# Patient Record
Sex: Female | Born: 1945 | Race: White | Hispanic: No | Marital: Married | Smoking: Never smoker
Health system: Southern US, Community
[De-identification: ages and names within clinical notes are randomized; demographics above are authoritative.]

## PROBLEM LIST (undated history)

## (undated) DIAGNOSIS — D649 Anemia, unspecified: Secondary | ICD-10-CM

## (undated) DIAGNOSIS — K219 Gastro-esophageal reflux disease without esophagitis: Secondary | ICD-10-CM

## (undated) DIAGNOSIS — I10 Essential (primary) hypertension: Secondary | ICD-10-CM

## (undated) DIAGNOSIS — M1990 Unspecified osteoarthritis, unspecified site: Secondary | ICD-10-CM

## (undated) DIAGNOSIS — Z8739 Personal history of other diseases of the musculoskeletal system and connective tissue: Secondary | ICD-10-CM

## (undated) DIAGNOSIS — Z8709 Personal history of other diseases of the respiratory system: Secondary | ICD-10-CM

## (undated) DIAGNOSIS — M2550 Pain in unspecified joint: Secondary | ICD-10-CM

## (undated) HISTORY — PX: COLONOSCOPY: SHX174

## (undated) HISTORY — PX: ESOPHAGOGASTRODUODENOSCOPY: SHX1529

---
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
## 1998-09-24 HISTORY — PX: GASTRIC BYPASS: SHX52

## 1999-09-25 HISTORY — PX: ABDOMINAL HYSTERECTOMY: SHX81

## 2014-03-04 ENCOUNTER — Encounter (HOSPITAL_COMMUNITY): Payer: Self-pay | Admitting: Pharmacy Technician

## 2014-03-10 ENCOUNTER — Encounter (HOSPITAL_COMMUNITY): Payer: Self-pay

## 2014-03-10 ENCOUNTER — Encounter (HOSPITAL_COMMUNITY)
Admission: RE | Admit: 2014-03-10 | Discharge: 2014-03-10 | Disposition: A | Discharge status: Home / Self Care | Payer: Medicare Other | Source: Ambulatory Visit | Attending: Orthopaedic Surgery | Admitting: Orthopaedic Surgery

## 2014-03-10 ENCOUNTER — Ambulatory Visit (HOSPITAL_COMMUNITY)
Admission: RE | Admit: 2014-03-10 | Discharge: 2014-03-10 | Disposition: A | Discharge status: Home / Self Care | Payer: Medicare Other | Source: Ambulatory Visit | Attending: Orthopedic Surgery | Admitting: Orthopedic Surgery

## 2014-03-10 DIAGNOSIS — Z01812 Encounter for preprocedural laboratory examination: Secondary | ICD-10-CM | POA: Insufficient documentation

## 2014-03-10 DIAGNOSIS — Z01818 Encounter for other preprocedural examination: Secondary | ICD-10-CM | POA: Insufficient documentation

## 2014-03-10 HISTORY — DX: Essential (primary) hypertension: I10

## 2014-03-10 HISTORY — DX: Personal history of other diseases of the respiratory system: Z87.09

## 2014-03-10 HISTORY — DX: Personal history of other diseases of the musculoskeletal system and connective tissue: Z87.39

## 2014-03-10 HISTORY — DX: Pain in unspecified joint: M25.50

## 2014-03-10 HISTORY — DX: Gastro-esophageal reflux disease without esophagitis: K21.9

## 2014-03-10 HISTORY — DX: Unspecified osteoarthritis, unspecified site: M19.90

## 2014-03-10 HISTORY — DX: Anemia, unspecified: D64.9

## 2014-03-10 LAB — CBC WITH DIFFERENTIAL/PLATELET
BASOS ABS: 0 10*3/uL (ref 0.0–0.1)
BASOS PCT: 0 % (ref 0–1)
Eosinophils Absolute: 0.2 10*3/uL (ref 0.0–0.7)
Eosinophils Relative: 2 % (ref 0–5)
HCT: 30.3 % — ABNORMAL LOW (ref 36.0–46.0)
HEMOGLOBIN: 9.5 g/dL — AB (ref 12.0–15.0)
Lymphocytes Relative: 31 % (ref 12–46)
Lymphs Abs: 2.1 10*3/uL (ref 0.7–4.0)
MCH: 24.5 pg — ABNORMAL LOW (ref 26.0–34.0)
MCHC: 31.4 g/dL (ref 30.0–36.0)
MCV: 78.3 fL (ref 78.0–100.0)
MONOS PCT: 6 % (ref 3–12)
Monocytes Absolute: 0.4 10*3/uL (ref 0.1–1.0)
NEUTROS PCT: 61 % (ref 43–77)
Neutro Abs: 4 10*3/uL (ref 1.7–7.7)
Platelets: 298 10*3/uL (ref 150–400)
RBC: 3.87 MIL/uL (ref 3.87–5.11)
RDW: 16 % — AB (ref 11.5–15.5)
WBC: 6.7 10*3/uL (ref 4.0–10.5)

## 2014-03-10 LAB — COMPREHENSIVE METABOLIC PANEL
ALBUMIN: 3.6 g/dL (ref 3.5–5.2)
ALK PHOS: 110 U/L (ref 39–117)
ALT: 6 U/L (ref 0–35)
AST: 14 U/L (ref 0–37)
BILIRUBIN TOTAL: 0.3 mg/dL (ref 0.3–1.2)
BUN: 11 mg/dL (ref 6–23)
CHLORIDE: 105 meq/L (ref 96–112)
CO2: 25 mEq/L (ref 19–32)
Calcium: 9.4 mg/dL (ref 8.4–10.5)
Creatinine, Ser: 0.56 mg/dL (ref 0.50–1.10)
GFR calc Af Amer: >90 mL/min (ref >90)
GFR calc non Af Amer: >90 mL/min (ref >90)
Glucose, Bld: 103 mg/dL — ABNORMAL HIGH (ref 70–99)
POTASSIUM: 4.9 meq/L (ref 3.7–5.3)
SODIUM: 144 meq/L (ref 137–147)
Total Protein: 6.9 g/dL (ref 6.0–8.3)

## 2014-03-10 LAB — URINALYSIS, ROUTINE W REFLEX MICROSCOPIC
Bilirubin Urine: NEGATIVE
Glucose, UA: NEGATIVE mg/dL
Hgb urine dipstick: NEGATIVE
Ketones, ur: NEGATIVE mg/dL
LEUKOCYTES UA: NEGATIVE
NITRITE: NEGATIVE
Protein, ur: NEGATIVE mg/dL
SPECIFIC GRAVITY, URINE: 1.019 (ref 1.005–1.030)
Urobilinogen, UA: 1 mg/dL (ref 0.0–1.0)
pH: 5.5 (ref 5.0–8.0)

## 2014-03-10 LAB — TYPE AND SCREEN
ABO/RH(D): A NEG
Antibody Screen: NEGATIVE

## 2014-03-10 LAB — SURGICAL PCR SCREEN
MRSA, PCR: NEGATIVE
Staphylococcus aureus: NEGATIVE

## 2014-03-10 LAB — PROTIME-INR
INR: 1.01 (ref 0.00–1.49)
Prothrombin Time: 13.1 seconds (ref 11.6–15.2)

## 2014-03-10 LAB — ABO/RH: ABO/RH(D): A NEG

## 2014-03-10 LAB — APTT: aPTT: 31 seconds (ref 24–37)

## 2014-03-10 MED ORDER — CHLORHEXIDINE GLUCONATE 4 % EX LIQD: 60.0000 mL | Freq: Once | CUTANEOUS | Status: DC

## 2014-03-10 MED ORDER — CHLORHEXIDINE GLUCONATE 4 % EX LIQD: 60.0000 mL | Freq: Every day | CUTANEOUS | Status: DC

## 2014-03-10 NOTE — Pre-Procedure Instructions (Signed)
Leah Warner  03/10/2014   Your procedure is scheduled on:  Tues, June 23 @ 7:15 AM  [facility]  at 5:30 AM.  Call this number if you have problems the morning of surgery: [phone]   Remember:   Do not eat food or drink liquids after midnight.     Do not wear jewelry, make-up or nail polish.  Do not wear lotions, powders, or perfumes. You may wear deodorant.  Do not shave 48 hours prior to surgery.   Do not bring valuables to the hospital.  [facility] responsible                  for any belongings or valuables.               Contacts, dentures or bridgework may not be worn into surgery.  Leave suitcase in the car. After surgery it may be brought to your room.  For patients admitted to the hospital, discharge time is determined by your                treatment team.                 Special Instructions:  [place] - Preparing for Surgery  Before surgery, you can play an important role.  Because skin is not sterile, your skin needs to be as free of germs as possible.  You can reduce the number of germs on you skin by washing with CHG (chlorahexidine gluconate) soap before surgery.  CHG is an antiseptic cleaner which kills germs and bonds with the skin to continue killing germs even after washing.  Please DO NOT use if you have an allergy to CHG or antibacterial soaps.  If your skin becomes reddened/irritated stop using the CHG and inform your nurse when you arrive at Short Stay.  Do not shave (including legs and underarms) for at least 48 hours prior to the first CHG shower.  You may shave your face.  Please follow these instructions carefully:   1.  Shower with CHG Soap the night before surgery and the                                morning of Surgery.  2.  If you choose to wash your hair, wash your hair first as usual with your       normal shampoo.  3.  After you shampoo, rinse your hair and body thoroughly to remove the                       Shampoo.  4.  Use CHG as you would any other liquid soap.  You can apply chg directly       to the skin and wash gently with scrungie or a clean washcloth.  5.  Apply the CHG Soap to your body ONLY FROM THE NECK DOWN.        Do not use on open wounds or open sores.  Avoid contact with your eyes,       ears, mouth and genitals (private parts).  Wash genitals (private parts)       with your normal soap.  6.  Wash thoroughly, paying special attention to the area where your surgery        will be performed.  7.  Thoroughly rinse your body with warm water from  the neck down.  8.  DO NOT shower/wash with your normal soap after using and rinsing off       the CHG Soap.  9.  Pat yourself dry with a clean towel.            10.  Wear clean pajamas.            11.  Place clean sheets on your bed the night of your first shower and do not        sleep with pets.  Day of Surgery  Do not apply any lotions/deoderants the morning of surgery.  Please wear clean clothes to the hospital/surgery center.     Please read over the following fact sheets that you were given: Pain Booklet, Coughing and Deep Breathing, Blood Transfusion Information, MRSA Information and Surgical Site Infection Prevention

## 2014-03-10 NOTE — Progress Notes (Signed)
Pt doesn't have a cardiologist  Denies ever having a stress test/heart cath  Echo done 15+yrs ago  Denies EKG or CXR in past yr  [facility]

## 2014-03-11 LAB — URINE CULTURE

## 2014-03-15 MED ORDER — SODIUM CHLORIDE 0.9 % IV SOLN: INTRAVENOUS | Status: DC

## 2014-03-15 MED ORDER — ACETAMINOPHEN 10 MG/ML IV SOLN: 1000.0000 mg | Freq: Once | INTRAVENOUS | Status: DC

## 2014-03-15 MED ORDER — CEFAZOLIN SODIUM-DEXTROSE 2-3 GM-% IV SOLR
2.0000 g | INTRAVENOUS | Status: AC
  Administered 2014-03-16: 2 g via INTRAVENOUS
  Filled 2014-03-15: qty 50

## 2014-03-16 ENCOUNTER — Inpatient Hospital Stay (HOSPITAL_COMMUNITY)
Admission: RE | Admit: 2014-03-16 | Discharge: 2014-03-18 | DRG: 470 | Disposition: A | Discharge status: Home / Self Care | Payer: Medicare Other | Source: Ambulatory Visit | Attending: Orthopaedic Surgery | Admitting: Orthopaedic Surgery

## 2014-03-16 ENCOUNTER — Inpatient Hospital Stay (HOSPITAL_COMMUNITY): Payer: Medicare Other | Admitting: Anesthesiology

## 2014-03-16 ENCOUNTER — Encounter (HOSPITAL_COMMUNITY)
Admission: RE | Disposition: A | Discharge status: Home / Self Care | Payer: Self-pay | Source: Ambulatory Visit | Attending: Orthopaedic Surgery

## 2014-03-16 ENCOUNTER — Encounter (HOSPITAL_COMMUNITY): Payer: Medicare Other | Admitting: Anesthesiology

## 2014-03-16 ENCOUNTER — Encounter (HOSPITAL_COMMUNITY): Payer: Self-pay | Admitting: *Deleted

## 2014-03-16 DIAGNOSIS — D62 Acute posthemorrhagic anemia: Secondary | ICD-10-CM | POA: Diagnosis not present

## 2014-03-16 DIAGNOSIS — D519 Vitamin B12 deficiency anemia, unspecified: Secondary | ICD-10-CM | POA: Diagnosis present

## 2014-03-16 DIAGNOSIS — Z6839 Body mass index (BMI) 39.0-39.9, adult: Secondary | ICD-10-CM

## 2014-03-16 DIAGNOSIS — M1712 Unilateral primary osteoarthritis, left knee: Secondary | ICD-10-CM | POA: Diagnosis present

## 2014-03-16 DIAGNOSIS — I10 Essential (primary) hypertension: Secondary | ICD-10-CM | POA: Diagnosis present

## 2014-03-16 DIAGNOSIS — Z9884 Bariatric surgery status: Secondary | ICD-10-CM

## 2014-03-16 DIAGNOSIS — Z96659 Presence of unspecified artificial knee joint: Secondary | ICD-10-CM

## 2014-03-16 DIAGNOSIS — D509 Iron deficiency anemia, unspecified: Secondary | ICD-10-CM

## 2014-03-16 DIAGNOSIS — M1710 Unilateral primary osteoarthritis, unspecified knee: Principal | ICD-10-CM | POA: Diagnosis present

## 2014-03-16 HISTORY — PX: TOTAL KNEE ARTHROPLASTY: SHX125

## 2014-03-16 SURGERY — ARTHROPLASTY, KNEE, TOTAL
Anesthesia: General | Site: Knee | Laterality: Left

## 2014-03-16 MED ORDER — FENTANYL CITRATE 0.05 MG/ML IJ SOLN
INTRAMUSCULAR | Status: DC | PRN
  Administered 2014-03-16: 125 ug via INTRAVENOUS
  Administered 2014-03-16 (×5): 25 ug via INTRAVENOUS
  Administered 2014-03-16 (×2): 50 ug via INTRAVENOUS
  Administered 2014-03-16 (×3): 25 ug via INTRAVENOUS

## 2014-03-16 MED ORDER — FENTANYL CITRATE 0.05 MG/ML IJ SOLN
INTRAMUSCULAR | Status: AC
  Filled 2014-03-16: qty 5

## 2014-03-16 MED ORDER — LIDOCAINE HCL (CARDIAC) 20 MG/ML IV SOLN
INTRAVENOUS | Status: DC | PRN
  Administered 2014-03-16: 40 mg via INTRAVENOUS

## 2014-03-16 MED ORDER — FLEET ENEMA 7-19 GM/118ML RE ENEM: 1.0000 | ENEMA | Freq: Once | RECTAL | Status: AC | PRN

## 2014-03-16 MED ORDER — KETOROLAC TROMETHAMINE 15 MG/ML IJ SOLN
15.0000 mg | Freq: Four times a day (QID) | INTRAMUSCULAR | Status: AC
  Administered 2014-03-16 (×2): 15 mg via INTRAVENOUS
  Filled 2014-03-16: qty 1

## 2014-03-16 MED ORDER — ONDANSETRON HCL 4 MG/2ML IJ SOLN
INTRAMUSCULAR | Status: AC
  Filled 2014-03-16: qty 2

## 2014-03-16 MED ORDER — RIVAROXABAN 10 MG PO TABS
10.0000 mg | ORAL_TABLET | ORAL | Status: DC
  Administered 2014-03-16 – 2014-03-17 (×2): 10 mg via ORAL
  Filled 2014-03-16 (×3): qty 1

## 2014-03-16 MED ORDER — SODIUM CHLORIDE 0.9 % IR SOLN
Status: DC | PRN
  Administered 2014-03-16: 1

## 2014-03-16 MED ORDER — HYDROMORPHONE HCL PF 1 MG/ML IJ SOLN
INTRAMUSCULAR | Status: AC
  Filled 2014-03-16: qty 1

## 2014-03-16 MED ORDER — THROMBIN 20000 UNITS EX SOLR
CUTANEOUS | Status: AC
  Filled 2014-03-16: qty 20000

## 2014-03-16 MED ORDER — BUPIVACAINE-EPINEPHRINE (PF) 0.25% -1:200000 IJ SOLN
INTRAMUSCULAR | Status: AC
  Filled 2014-03-16: qty 30

## 2014-03-16 MED ORDER — ONDANSETRON HCL 4 MG/2ML IJ SOLN
INTRAMUSCULAR | Status: DC | PRN
Start: 2014-03-16 — End: 2014-03-16
  Administered 2014-03-16: 4 mg via INTRAVENOUS

## 2014-03-16 MED ORDER — MAGNESIUM HYDROXIDE 400 MG/5ML PO SUSP: 30.0000 mL | Freq: Every day | ORAL | Status: DC | PRN

## 2014-03-16 MED ORDER — LIDOCAINE HCL (CARDIAC) 20 MG/ML IV SOLN
INTRAVENOUS | Status: AC
  Filled 2014-03-16: qty 5

## 2014-03-16 MED ORDER — KETOROLAC TROMETHAMINE 30 MG/ML IJ SOLN: 15.0000 mg | Freq: Once | INTRAMUSCULAR | Status: DC | PRN

## 2014-03-16 MED ORDER — BUPIVACAINE-EPINEPHRINE 0.25% -1:200000 IJ SOLN
INTRAMUSCULAR | Status: DC | PRN
  Administered 2014-03-16: 30 mL

## 2014-03-16 MED ORDER — HYDROMORPHONE HCL PF 1 MG/ML IJ SOLN
0.2500 mg | INTRAMUSCULAR | Status: DC | PRN
  Administered 2014-03-16: 0.25 mg via INTRAVENOUS

## 2014-03-16 MED ORDER — OXYCODONE HCL 5 MG PO TABS
5.0000 mg | ORAL_TABLET | ORAL | Status: DC | PRN
  Administered 2014-03-16 – 2014-03-18 (×9): 10 mg via ORAL
  Filled 2014-03-16 (×9): qty 2

## 2014-03-16 MED ORDER — SODIUM CHLORIDE 0.9 % IV SOLN
75.0000 mL/h | INTRAVENOUS | Status: DC
  Administered 2014-03-17: 75 mL/h via INTRAVENOUS

## 2014-03-16 MED ORDER — THROMBIN 20000 UNITS EX KIT
PACK | CUTANEOUS | Status: DC | PRN
  Administered 2014-03-16: 20000 [IU] via TOPICAL

## 2014-03-16 MED ORDER — MIDAZOLAM HCL 5 MG/5ML IJ SOLN
INTRAMUSCULAR | Status: DC | PRN
  Administered 2014-03-16: 1 mg via INTRAVENOUS

## 2014-03-16 MED ORDER — METHOCARBAMOL 500 MG PO TABS
500.0000 mg | ORAL_TABLET | Freq: Four times a day (QID) | ORAL | Status: DC | PRN
  Administered 2014-03-16 – 2014-03-18 (×3): 500 mg via ORAL
  Filled 2014-03-16 (×3): qty 1

## 2014-03-16 MED ORDER — ONDANSETRON HCL 4 MG/2ML IJ SOLN: 4.0000 mg | Freq: Once | INTRAMUSCULAR | Status: DC | PRN

## 2014-03-16 MED ORDER — SUCCINYLCHOLINE CHLORIDE 20 MG/ML IJ SOLN
INTRAMUSCULAR | Status: DC | PRN
  Administered 2014-03-16: 30 mg via INTRAVENOUS
  Administered 2014-03-16: 120 mg via INTRAVENOUS

## 2014-03-16 MED ORDER — ALUM & MAG HYDROXIDE-SIMETH 200-200-20 MG/5ML PO SUSP
30.0000 mL | ORAL | Status: DC | PRN
  Administered 2014-03-17: 30 mL via ORAL
  Filled 2014-03-16: qty 30

## 2014-03-16 MED ORDER — LISINOPRIL 40 MG PO TABS
40.0000 mg | ORAL_TABLET | Freq: Every day | ORAL | Status: DC
  Administered 2014-03-16 – 2014-03-18 (×3): 40 mg via ORAL
  Filled 2014-03-16 (×3): qty 1

## 2014-03-16 MED ORDER — ONDANSETRON HCL 4 MG/2ML IJ SOLN: 4.0000 mg | Freq: Four times a day (QID) | INTRAMUSCULAR | Status: DC | PRN

## 2014-03-16 MED ORDER — METHOCARBAMOL 1000 MG/10ML IJ SOLN
500.0000 mg | Freq: Four times a day (QID) | INTRAVENOUS | Status: DC | PRN
  Filled 2014-03-16: qty 5

## 2014-03-16 MED ORDER — METOCLOPRAMIDE HCL 10 MG PO TABS: 5.0000 mg | ORAL_TABLET | Freq: Three times a day (TID) | ORAL | Status: DC | PRN

## 2014-03-16 MED ORDER — PHENOL 1.4 % MT LIQD: 1.0000 | OROMUCOSAL | Status: DC | PRN

## 2014-03-16 MED ORDER — CEFAZOLIN SODIUM-DEXTROSE 2-3 GM-% IV SOLR
2.0000 g | Freq: Four times a day (QID) | INTRAVENOUS | Status: AC
  Administered 2014-03-16 (×2): 2 g via INTRAVENOUS
  Filled 2014-03-16 (×2): qty 50

## 2014-03-16 MED ORDER — ONDANSETRON HCL 4 MG PO TABS: 4.0000 mg | ORAL_TABLET | Freq: Four times a day (QID) | ORAL | Status: DC | PRN

## 2014-03-16 MED ORDER — DOCUSATE SODIUM 100 MG PO CAPS
100.0000 mg | ORAL_CAPSULE | Freq: Two times a day (BID) | ORAL | Status: DC
  Administered 2014-03-16 – 2014-03-18 (×5): 100 mg via ORAL
  Filled 2014-03-16 (×5): qty 1

## 2014-03-16 MED ORDER — ACETAMINOPHEN 325 MG PO TABS
650.0000 mg | ORAL_TABLET | Freq: Four times a day (QID) | ORAL | Status: DC | PRN
  Administered 2014-03-17: 650 mg via ORAL
  Filled 2014-03-16 (×2): qty 2

## 2014-03-16 MED ORDER — BISACODYL 10 MG RE SUPP: 10.0000 mg | Freq: Every day | RECTAL | Status: DC | PRN

## 2014-03-16 MED ORDER — PROPOFOL 10 MG/ML IV BOLUS
INTRAVENOUS | Status: AC
  Filled 2014-03-16: qty 20

## 2014-03-16 MED ORDER — ALBUTEROL SULFATE HFA 108 (90 BASE) MCG/ACT IN AERS
INHALATION_SPRAY | RESPIRATORY_TRACT | Status: DC | PRN
  Administered 2014-03-16: 2 via RESPIRATORY_TRACT

## 2014-03-16 MED ORDER — ACETAMINOPHEN 10 MG/ML IV SOLN
INTRAVENOUS | Status: AC
Start: 2014-03-16 — End: 2014-03-16
  Filled 2014-03-16: qty 100

## 2014-03-16 MED ORDER — ACETAMINOPHEN 10 MG/ML IV SOLN
1000.0000 mg | Freq: Four times a day (QID) | INTRAVENOUS | Status: AC
  Administered 2014-03-16 – 2014-03-17 (×3): 1000 mg via INTRAVENOUS
  Filled 2014-03-16 (×3): qty 100

## 2014-03-16 MED ORDER — MENTHOL 3 MG MT LOZG
1.0000 | LOZENGE | OROMUCOSAL | Status: DC | PRN
  Administered 2014-03-18: 3 mg via ORAL
  Filled 2014-03-16: qty 9

## 2014-03-16 MED ORDER — KETOROLAC TROMETHAMINE 15 MG/ML IJ SOLN
INTRAMUSCULAR | Status: AC
  Filled 2014-03-16: qty 1

## 2014-03-16 MED ORDER — HYDROMORPHONE HCL PF 1 MG/ML IJ SOLN
0.5000 mg | INTRAMUSCULAR | Status: DC | PRN
  Administered 2014-03-17: 0.5 mg via INTRAVENOUS
  Filled 2014-03-16: qty 1

## 2014-03-16 MED ORDER — ACETAMINOPHEN 650 MG RE SUPP: 650.0000 mg | Freq: Four times a day (QID) | RECTAL | Status: DC | PRN

## 2014-03-16 MED ORDER — MIDAZOLAM HCL 2 MG/2ML IJ SOLN
INTRAMUSCULAR | Status: AC
  Filled 2014-03-16: qty 2

## 2014-03-16 MED ORDER — LACTATED RINGERS IV SOLN
INTRAVENOUS | Status: DC | PRN
  Administered 2014-03-16: 07:00:00 via INTRAVENOUS

## 2014-03-16 MED ORDER — PROPOFOL 10 MG/ML IV BOLUS
INTRAVENOUS | Status: DC | PRN
  Administered 2014-03-16: 20 mg via INTRAVENOUS
  Administered 2014-03-16: 180 mg via INTRAVENOUS

## 2014-03-16 MED ORDER — THROMBIN 20000 UNITS EX KIT
PACK | CUTANEOUS | Status: AC
  Filled 2014-03-16: qty 1

## 2014-03-16 MED ORDER — METOCLOPRAMIDE HCL 5 MG/ML IJ SOLN: 5.0000 mg | Freq: Three times a day (TID) | INTRAMUSCULAR | Status: DC | PRN

## 2014-03-16 SURGICAL SUPPLY — 65 items
BANDAGE ESMARK 6X9 LF (GAUZE/BANDAGES/DRESSINGS) ×1 IMPLANT
BLADE SAGITTAL 25.0X1.19X90 (BLADE) ×2 IMPLANT
BLADE SAGITTAL 25.0X1.19X90MM (BLADE) ×1
BNDG ESMARK 6X9 LF (GAUZE/BANDAGES/DRESSINGS) ×3
BOWL SMART MIX CTS (DISPOSABLE) ×3 IMPLANT
CAP UPCHARGE REVISION TRAY ×3 IMPLANT
CAPT RP KNEE ×3 IMPLANT
CEMENT HV SMART SET (Cement) ×6 IMPLANT
COVER BACK TABLE 24X17X13 BIG (DRAPES) ×3 IMPLANT
COVER SURGICAL LIGHT HANDLE (MISCELLANEOUS) ×3 IMPLANT
CUFF TOURNIQUET SINGLE 34IN LL (TOURNIQUET CUFF) IMPLANT
CUFF TOURNIQUET SINGLE 44IN (TOURNIQUET CUFF) ×3 IMPLANT
DRAPE EXTREMITY T 121X128X90 (DRAPE) ×3 IMPLANT
DRAPE PROXIMA HALF (DRAPES) ×3 IMPLANT
DRSG ADAPTIC 3X8 NADH LF (GAUZE/BANDAGES/DRESSINGS) ×3 IMPLANT
DRSG PAD ABDOMINAL 8X10 ST (GAUZE/BANDAGES/DRESSINGS) ×6 IMPLANT
DURAPREP 26ML APPLICATOR (WOUND CARE) ×3 IMPLANT
ELECT CAUTERY BLADE 6.4 (BLADE) ×3 IMPLANT
ELECT REM PT RETURN 9FT ADLT (ELECTROSURGICAL) ×3
ELECTRODE REM PT RTRN 9FT ADLT (ELECTROSURGICAL) ×1 IMPLANT
EVACUATOR 1/8 PVC DRAIN (DRAIN) IMPLANT
FACESHIELD WRAPAROUND (MASK) ×6 IMPLANT
FLOSEAL 10ML (HEMOSTASIS) IMPLANT
GLOVE BIOGEL M STRL SZ7.5 (GLOVE) ×6 IMPLANT
GLOVE BIOGEL PI IND STRL 7.0 (GLOVE) ×4 IMPLANT
GLOVE BIOGEL PI IND STRL 8 (GLOVE) ×1 IMPLANT
GLOVE BIOGEL PI IND STRL 8.5 (GLOVE) ×1 IMPLANT
GLOVE BIOGEL PI INDICATOR 7.0 (GLOVE) ×8
GLOVE BIOGEL PI INDICATOR 8 (GLOVE) ×2
GLOVE BIOGEL PI INDICATOR 8.5 (GLOVE) ×2
GLOVE ECLIPSE 8.0 STRL XLNG CF (GLOVE) ×6 IMPLANT
GLOVE SURG ORTHO 8.5 STRL (GLOVE) ×9 IMPLANT
GLOVE SURG SS PI 7.0 STRL IVOR (GLOVE) ×6 IMPLANT
GOWN STRL REUS W/ TWL LRG LVL3 (GOWN DISPOSABLE) ×4 IMPLANT
GOWN STRL REUS W/TWL 2XL LVL3 (GOWN DISPOSABLE) ×3 IMPLANT
GOWN STRL REUS W/TWL LRG LVL3 (GOWN DISPOSABLE) ×8
HANDPIECE INTERPULSE COAX TIP (DISPOSABLE) ×2
KIT BASIN OR (CUSTOM PROCEDURE TRAY) ×3 IMPLANT
KIT ROOM TURNOVER OR (KITS) ×3 IMPLANT
MANIFOLD NEPTUNE II (INSTRUMENTS) ×3 IMPLANT
NEEDLE 22X1 1/2 (OR ONLY) (NEEDLE) IMPLANT
NS IRRIG 1000ML POUR BTL (IV SOLUTION) ×3 IMPLANT
PACK TOTAL JOINT (CUSTOM PROCEDURE TRAY) ×3 IMPLANT
PAD ARMBOARD 7.5X6 YLW CONV (MISCELLANEOUS) ×6 IMPLANT
PAD CAST 4YDX4 CTTN HI CHSV (CAST SUPPLIES) ×1 IMPLANT
PADDING CAST COTTON 4X4 STRL (CAST SUPPLIES) ×2
PADDING CAST COTTON 6X4 STRL (CAST SUPPLIES) ×3 IMPLANT
SET HNDPC FAN SPRY TIP SCT (DISPOSABLE) ×1 IMPLANT
SPONGE GAUZE 4X4 12PLY (GAUZE/BANDAGES/DRESSINGS) ×3 IMPLANT
SPONGE GAUZE 4X4 12PLY STER LF (GAUZE/BANDAGES/DRESSINGS) ×3 IMPLANT
STAPLER VISISTAT 35W (STAPLE) ×3 IMPLANT
SUCTION FRAZIER TIP 10 FR DISP (SUCTIONS) ×3 IMPLANT
SUT BONE WAX W31G (SUTURE) ×3 IMPLANT
SUT ETHIBOND NAB CT1 #1 30IN (SUTURE) ×9 IMPLANT
SUT MNCRL AB 3-0 PS2 18 (SUTURE) ×3 IMPLANT
SUT VIC AB 0 CT1 27 (SUTURE) ×2
SUT VIC AB 0 CT1 27XBRD ANBCTR (SUTURE) ×1 IMPLANT
SUT VIC AB 1 CT1 27 (SUTURE) ×2
SUT VIC AB 1 CT1 27XBRD ANBCTR (SUTURE) ×1 IMPLANT
SYR CONTROL 10ML LL (SYRINGE) IMPLANT
TOWEL OR 17X24 6PK STRL BLUE (TOWEL DISPOSABLE) ×3 IMPLANT
TOWEL OR 17X26 10 PK STRL BLUE (TOWEL DISPOSABLE) ×3 IMPLANT
TRAY FOLEY CATH 16FRSI W/METER (SET/KITS/TRAYS/PACK) IMPLANT
WATER STERILE IRR 1000ML POUR (IV SOLUTION) ×6 IMPLANT
WRAP KNEE MAXI GEL POST OP (GAUZE/BANDAGES/DRESSINGS) ×3 IMPLANT

## 2014-03-16 NOTE — Transfer of Care (Signed)
Immediate Anesthesia Transfer of Care Note  Patient: Leah Warner  Procedure(s) Performed: Procedure(s): TOTAL KNEE ARTHROPLASTY (Left)  Patient Location: PACU  Anesthesia Type:General  Level of Consciousness: awake, alert  and oriented  Airway & Oxygen Therapy: Patient Spontanous Breathing  Post-op Assessment: Report given to PACU RN  Post vital signs: Reviewed and stable  Complications: No apparent anesthesia complications

## 2014-03-16 NOTE — Discharge Instructions (Signed)
Information on my medicine - XARELTO® (Rivaroxaban) ° °This medication education was reviewed with me or my healthcare representative as part of my discharge preparation.  The pharmacist that spoke with me during my hospital stay was:  [place], Devon Kingdon Danielle, RPH ° °Why was Xarelto® prescribed for you? °Xarelto® was prescribed for you to reduce the risk of blood clots forming after orthopedic surgery. The medical term for these abnormal blood clots is venous thromboembolism (VTE). ° °What do you need to know about xarelto® ? °Take your Xarelto® ONCE DAILY at the same time every day. °You may take it either with or without food. ° °If you have difficulty swallowing the tablet whole, you may crush it and mix in applesauce just prior to taking your dose. ° °Take Xarelto® exactly as prescribed by your doctor and DO NOT stop taking Xarelto® without talking to the doctor who prescribed the medication.  Stopping without other VTE prevention medication to take the place of Xarelto® may increase your risk of developing a clot. ° °After discharge, you should have regular check-up appointments with your healthcare provider that is prescribing your Xarelto®.   ° °What do you do if you miss a dose? °If you miss a dose, take it as soon as you remember on the same day then continue your regularly scheduled once daily regimen the next day. Do not take two doses of Xarelto® on the same day.  ° °Important Safety Information °A possible side effect of Xarelto® is bleeding. You should call your healthcare provider right away if you experience any of the following: °? Bleeding from an injury or your nose that does not stop. °? Unusual colored urine (red or dark brown) or unusual colored stools (red or black). °? Unusual bruising for unknown reasons. °? A serious fall or if you hit your head (even if there is no bleeding). ° °Some medicines may interact with Xarelto® and might increase your risk of bleeding while on Xarelto®. To help  avoid this, consult your healthcare provider or pharmacist prior to using any new prescription or non-prescription medications, including herbals, vitamins, non-steroidal anti-inflammatory drugs (NSAIDs) and supplements. ° °This website has more information on Xarelto®: www.xarelto.com. ° ° °

## 2014-03-16 NOTE — H&P (Signed)
  The recent History & Physical has been reviewed. I have personally examined the patient today. There is no interval change to the documented History & Physical. The patient would like to proceed with the procedure.  Norlene CampbellWHITFIELD, PETER W 03/16/2014,  7:03 AM

## 2014-03-16 NOTE — Progress Notes (Signed)
Utilization review completed.  

## 2014-03-16 NOTE — Anesthesia Procedure Notes (Addendum)
Anesthesia Regional Block:  Adductor canal block  Pre-Anesthetic Checklist: ,, timeout performed,, Correct Site,, Correct Procedure,, site marked,,, surgical consent,, at surgeon's request and post-op pain management  Laterality: Left     Adductor canal block Narrative:    Anesthesia Regional Block:   Narrative:

## 2014-03-16 NOTE — Op Note (Signed)
PATIENT ID:      Leah PatellaMarie Warner  MRN:     [id number] DOB/AGE:    23-Nov-1945 / 68 y.o.       OPERATIVE REPORT    DATE OF PROCEDURE:  03/16/2014       PREOPERATIVE DIAGNOSIS:   LEFT KNEE OSTEOARTHRITIS-END STAGE                                                       Estimated body mass index is 39.3 kg/(m^2) as calculated from the following:   Height as of 03/10/14: 5\' 1"  (1.549 m).   Weight as of this encounter: 94.291 kg (207 lb 14 oz).     POSTOPERATIVE DIAGNOSIS:   LEFT KNEE OSTEOARTHRITIS-END STAGE                                                                     Estimated body mass index is 39.3 kg/(m^2) as calculated from the following:   Height as of 03/10/14: 5\' 1"  (1.549 m).   Weight as of this encounter: 94.291 kg (207 lb 14 oz).     PROCEDURE:  Procedure(s): TOTAL KNEE ARTHROPLASTY left     SURGEON:  Norlene CampbellPeter Whitfield, MD    ASSISTANT:   Jacqualine CodeBrian Petrarca, PA-C   (Present and scrubbed throughout the case, critical for assistance with exposure, retraction, instrumentation, and closure.)          ANESTHESIA: regional and general     DRAINS: (LEFT KNEE) Hemovact drain(s) in the CLAMPED with  Suction Clamped :      TOURNIQUET TIME:  Total Tourniquet Time Documented: Thigh (Left) - 84 minutes Total: Thigh (Left) - 84 minutes     COMPLICATIONS:  None   CONDITION:  stable  PROCEDURE IN [id number]   Norlene CampbellWHITFIELD, PETER W 03/16/2014, 9:28 AM

## 2014-03-16 NOTE — Anesthesia Preprocedure Evaluation (Addendum)
Anesthesia Evaluation  Patient identified by MRN, date of birth, ID band Patient awake    Reviewed: Allergy & Precautions, H&P , NPO status , Patient's Chart, lab work & pertinent test results  Airway Mallampati: II TM Distance: >3 FB Neck ROM: Full    Dental  (+) Teeth Intact, Dental Advisory Given   Pulmonary  breath sounds clear to auscultation        Cardiovascular hypertension, Pt. on medications Rhythm:Regular Rate:Normal     Neuro/Psych    GI/Hepatic GERD-  Controlled,  Endo/Other    Renal/GU      Musculoskeletal   Abdominal (+)  Abdomen: soft.    Peds  Hematology   Anesthesia Other Findings   Reproductive/Obstetrics                         Anesthesia Physical Anesthesia Plan  ASA: II  Anesthesia Plan: General   Post-op Pain Management:    Induction: Intravenous  Airway Management Planned: LMA and Oral ETT  Additional Equipment:   Intra-op Plan:   Post-operative Plan: Extubation in OR  Informed Consent: I have reviewed the patients History and Physical, chart, labs and discussed the procedure including the risks, benefits and alternatives for the proposed anesthesia with the patient or authorized representative who has indicated his/her understanding and acceptance.   Dental advisory given  Plan Discussed with: Anesthesiologist, Surgeon and CRNA  Anesthesia Plan Comments: (DJD L. Knee  Plan GA with LMA and adductor canal block)       Anesthesia Quick Evaluation

## 2014-03-16 NOTE — Evaluation (Signed)
Physical Therapy Evaluation Patient Details Name: Leah Warner MRN: [id number] DOB: [birth year] Today's Date: 03/16/2014   History of Present Illness  68 y.o. female s/p left TKA with a 50% weight bearing status.  Clinical Impression  Pt is s/p left TKA resulting in the deficits listed below (see PT Problem List). Able to ambulate short distance (4 feet) to chair this afternoon post-op day #0 with min assist. Significant weakness of left ankle dorsiflexion (trace to no muscle activation - see general comments below.) Anticipate she will progress well with therapy and states she will have24 hour care from her husband at home upon d/c. Pt will benefit from skilled PT to increase their independence and safety with mobility to allow discharge to the venue listed below.     Follow Up Recommendations Home health PT;Supervision for mobility/OOB    Equipment Recommendations  None recommended by PT    Recommendations for Other Services OT consult     Precautions / Restrictions Precautions Precautions: Knee Precaution Comments: Reviewed knee precautions and use of footsie roll Restrictions Weight Bearing Restrictions: Yes LLE Weight Bearing: Partial weight bearing LLE Partial Weight Bearing Percentage or Pounds: 50%      Mobility  Bed Mobility Overal bed mobility: Needs Assistance Bed Mobility: Supine to Sit     Supine to sit: Min assist;HOB elevated     General bed mobility comments: Min assist to bring LLE to edge of bed for support. HOB elevated with VCs for technique. Requires extra time.  Transfers Overall transfer level: Needs assistance Equipment used: Rolling walker (2 wheeled) Transfers: Sit to/from Stand Sit to Stand: Min guard         General transfer comment: Min guard for safety from lowest bed setting. VCs for hand and foot placement. Instructed to prevent >50% WB through RLE upon standing.  Ambulation/Gait Ambulation/Gait assistance: Min assist Ambulation  Distance (Feet): 4 Feet Assistive device: Rolling walker (2 wheeled) Gait Pattern/deviations: Step-to pattern;Decreased step length - right;Decreased stance time - left;Antalgic;Decreased dorsiflexion - left   Gait velocity interpretation: Below normal speed for age/gender General Gait Details: Educated on safe DME use and how to maintain 50% WB status for LLE with use of UEs. Min assist for sequencing and walker control. Demonstrating good UE strength to decrease WB on LLE.  Stairs            Wheelchair Mobility    Modified Rankin (Stroke Patients Only)       Balance Overall balance assessment: Needs assistance Sitting-balance support: No upper extremity supported;Feet supported Sitting balance-Leahy Scale: Good     Standing balance support: No upper extremity supported Standing balance-Leahy Scale: Fair                               Pertinent Vitals/Pain 3/10 pain Nurse aware Patient repositioned in chair for comfort.     Home Living Family/patient expects to be discharged to:: Private residence Living Arrangements: Spouse/significant other Available Help at Discharge: Family;Available 24 hours/day Type of Home: House Home Access: Stairs to enter Entrance Stairs-Rails: None Entrance Stairs-Number of Steps: 2 Home Layout: Two level;Laundry or work area in basement;Able to live on main level with bedroom/bathroom Home Equipment: Environmental consultantWalker - 2 wheels;Bedside commode;Shower seat - built in Additional Comments: Does not need to go into basement.    Prior Function Level of Independence: Independent               Hand Dominance  Dominant Hand: Right    Extremity/Trunk Assessment   Upper Extremity Assessment: Defer to OT evaluation           Lower Extremity Assessment: LLE deficits/detail   LLE Deficits / Details: Decreased strength and ROM as expected Post-op TKA. Trace to no muscle activation with left ankle dorsiflexon and left extensor  hallicus longus. No light touch sensation L3-L5 dermatome distribution LLE     Communication   Communication: No difficulties  Cognition Arousal/Alertness: Awake/alert Behavior During Therapy: WFL for tasks assessed/performed Overall Cognitive Status: Within Functional Limits for tasks assessed                      General Comments General comments (skin integrity, edema, etc.): Trace to no muscle activation with left ankle dorsiflexon and left extensor hallicus longus. No light touch sensation L3-L5 dermatome distribution LLE; S1 normal to light touch.    Exercises Total Joint Exercises Ankle Circles/Pumps: AROM;Both;10 reps;Limitations;Supine (decreased Lt dorsiflexion activation) Ankle Circles/Pumps Limitations: decreased Lt ankle dorsiflexion activation Quad Sets: AROM;Left;10 reps;Supine      Assessment/Plan    PT Assessment Patient needs continued PT services  PT Diagnosis Difficulty walking;Abnormality of gait;Acute pain   PT Problem List Decreased strength;Decreased range of motion;Decreased activity tolerance;Decreased balance;Decreased mobility;Decreased knowledge of use of DME;Decreased knowledge of precautions;Impaired sensation;Pain  PT Treatment Interventions DME instruction;Gait training;Stair training;Functional mobility training;Therapeutic activities;Therapeutic exercise;Balance training;Neuromuscular re-education;Patient/family education;Modalities   PT Goals (Current goals can be found in the Care Plan section) Acute Rehab PT Goals Patient Stated Goal: Be able to walk without pain PT Goal Formulation: With patient Time For Goal Achievement: 03/23/14 Potential to Achieve Goals: Good    Frequency 7X/week   Barriers to discharge        Co-evaluation               End of Session Equipment Utilized During Treatment: Gait belt Activity Tolerance: Patient tolerated treatment well Patient left: in chair;with call bell/phone within reach;with  family/visitor present Nurse Communication: Mobility status;Weight bearing status         Time: [id number] PT Time Calculation (min): 37 min   Charges:   PT Evaluation $Initial PT Evaluation Tier I: 1 Procedure PT Treatments $Therapeutic Activity: 8-22 mins $Self Care/Home Management: 8-22   PT G Codes:         Charlsie MerlesLogan Secor Barbour, PT [phone]  Berton MountBarbour, Logan S 03/16/2014, 3:49 PM

## 2014-03-16 NOTE — Progress Notes (Signed)
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 Cleophas DunkerWhitfield. An adductor canal block was performed in the preoperative holding area and she was brought to the operating room and underwent general anesthesia with a laryngeal mask airway. The airway seemed to be functioning well until approximately 30 minutes into the surgery when she was noted to have gastric secretions within the laryngeal mask airway. The decision was then made to convert to a endotracheal tube. This was done with succinyl choline for muscle relaxation and she was intubated with a MAC 3 blade and a 7.0 mm endotracheal tube was inserted. A small amount of yellowish greenish gastric fluid was suctioned from the endotracheal tube and it was elected to perform bronchoscopy. This was done and approximately 10 cc of yellowish fluid was suctioned from the pulmonary tree.  She then remained stable throughout the surgery with oxygen saturation of 199-100% on FiO2 1.0. At the completion of the procedure she was extubated without difficulty and brought to the recovery room. He remained stable in the PACU with good oxygen saturations and no respiratory difficulty and was sent to 5N  She now appears to be resting comfortably on 5 north. She denies any dyspnea cough or tachypnea.  Vital signs: Temperature 36.8 blood pressure 150/53 heart rate 97 respiratory rate 13 oxygen saturation 95% on 3 L  Lungs clear to auscultation with good air movement and no crackles or wheezes  Heart: Regular rate and rhythm no murmurs  Impression: 68 year old female status post left total knee replacement with regurgitation of gastric contents into a laryngeal mask airway during surgery. An endotracheal tube was then inserted. She now appears to be clinically stable without evidence of lung injury. The patient and her husband were informed of the  intra-operative events mentioned above.  Plan: Followup in a.m. Incentive spirometry chest x-ray if she develops any respiratory symptoms  Kipp Broodavid Jazira Maloney, M.D.

## 2014-03-16 NOTE — Op Note (Signed)
NAME:  Leah Warner, Leah Warner                 ACCOUNT NO.:  [id number]  MEDICAL RECORD NO.:  [id number]  LOCATION:  [facility]                         FACILITY:  [facility]  PHYSICIAN:  Vonna Kotyk. Nalleli Largent, M.D.DATE OF BIRTH:  1946-06-22  DATE OF PROCEDURE:  03/16/2014 DATE OF DISCHARGE:                              OPERATIVE REPORT   PREOPERATIVE DIAGNOSES: 1. End-stage osteoarthritis, left knee. 2. Morbid obesity with BMI of 39.  POSTOPERATIVE DIAGNOSES: 1. End-stage osteoarthritis, left knee. 2. Morbid obesity with BMI of 39.  PROCEDURE:  Left total knee replacement.  SURGEON:  Vonna Kotyk. Durward Fortes, MD  ASSISTANT:  Biagio Borg, PA-C, who was present throughout the operative procedure to ensure its time of completion.  ANESTHESIA:  General with supplemental femoral nerve block.  COMPLICATIONS:  There was evidence of possible aspiration with some yellow secretions around the LMA tube.  Dr. Linna Caprice suctioned the patient and then inserted the endotracheal tube and will monitor her postoperatively.  COMPONENTS:  DePuy MBT revision #3 tibial tray, a 12.5-mm polyethylene bridging bearing, a standard plus femoral component, a metal backed 3- peg rotating patella.  Components were secured with polymethyl methacrylate without antibiotics.  PROCEDURE:  Leah Warner was met with her husband in the holding area, identified the left leg as the appropriate operative site and marked in accordingly.  Any questions were answered.  The patient did receive a preoperative femoral nerve block per Anesthesia.  The patient was then transported to room #7 and placed under general anesthesia without difficulty.  The nursing staff inserted a Foley catheter.  Urine was clear.  Time out was called.  A tourniquet was then placed on the left thigh.  With the leg elevated, it was prepped with chlorhexidine scrub and then DuraPrep from the tourniquet to the tips of the toes.  Sterile draping was performed. With the  extremity still elevated, it was Esmarch exsanguinated with a proximal tourniquet at 350 mmHg.  A midline longitudinal incision was made centered about the patella extending from the superior pouch to tibial tubercle.  Via sharp dissection, incision was carried down to the subcutaneous tissue.  There was abundant adipose tissue, and because of the patient's size,  the procedure was technically more difficult throughout the procedure.  The first layer of capsule was incised in the midline.  A medial parapatellar incision was made with the Bovie.  The joint was entered. There was a small clear yellow joint effusion.  Patella was everted 180 degrees laterally, the knee flexed to 90 degrees.  There was essentially complete loss of articular cartilage along the medial femoral condyle and tibial plateau.  I did do a medial release so I elected to place the knee in neutral.  First, bony cut was made transversely with a 2-degree angle of declination.  With each bony cut on the tibia and the femur, I checked the alignment with the external guide.  Subsequent cuts were then made on the femur using the standard plus guide which we had measured before making any cuts.  MCL and LCL remained intact.  A 4-degree distal femoral valgus cut was then made.  Laminar spreaders were placed along the medial and lateral compartment  to remove medial and lateral menisci, ACL, PCL and any osteophytes along the posterior femoral condyle.  There was a small Baker cyst postero medially.  There was a very large fabella laterally which I partially removed as I thought it was causing impingement.  Final cut was then made on the femur for obtaining the tapered cuts in the center hole.  Retractors were then placed around the tibia, was advanced anteriorly and measured a #3 tibial tray.  First bony cut was made as a centered cut.  We did check the alignment with the external guide.  The MBT revision tray was  utilized and a deeper hole was then made.  The MBT trial jig was then inserted and the keeled cut was then made.  With that and the jig in place, the flexion/extension gaps were perfectly symmetrical at 12-5 throughout the procedure.  The trial femoral component was then applied through a full range of motion.  The joint was stable.  We had excellent motion of the components without malrotation.  The patella was prepared by removing 10 mm of patella thickness with leaving 14 mm of patella.  The trial patella was applied.  It was tight laterally so I performed a lateral release with the Bovie, at that point the patella tracked in the midline.  Trial components removed.  The joint was copiously irrigated with saline solution.  With the knee bent 90 degrees retractor was placed around the tibia.  We irrigated the wound, then we applied the final components. We initially inserted the MBT revision of a 3 tibial tray followed by the 12.5-mm polyethylene bridging bearing and then standard plus femoral component.  The knee was then reduced.  Extraneous methacrylate removed from the periphery of the components.  The patella was then applied with methacrylate and a patellar clamp.  At approximately 16 minutes the methacrylate had matured and hardened during which time we injected the deep capsule with 0.25% Marcaine without epinephrine.  The patient has a history of anemia preoperatively with a hemoglobin of 9.5, hematocrit approximately 30, so we were very diligent about hemostasis.  Tourniquet was deflated at 84 minutes.  Any bleeding around the bone was controlled with bone wax.  The soft tissue bleeding was controlled with a Bovie, and we did apply topical thrombin, we had very minimal bleeding.  The Hemovac was then placed through the lateral compartment.  The deep capsule was then closed with #1 Ethibond suture, the superficial capsule closed with #1 Vicryl, subcu with 2-0 Vicryl and  3-0 Monocryl, skin closed with skin clips.  Sterile bulky dressing was applied followed by the patient's support stocking.  The patient was awoke, returned to the postanesthesia recovery room where she will be monitored by Dr. Linna Caprice __________ she will need a pulmonary consult.     Vonna Kotyk. Durward Fortes, M.D.     PWW/MEDQ  D:  03/16/2014  T:  03/16/2014  Job:  [id number]

## 2014-03-16 NOTE — Progress Notes (Signed)
Orthopedic Tech Progress Note Patient Details:  Leah PatellaMarie Warner 02-11-1946 [id number]  CPM Left Knee CPM Left Knee: On Left Knee Flexion (Degrees): 60 Left Knee Extension (Degrees): 0 Additional Comments: Trapeze bar and  foot roll   Shawnie PonsCammer, Ekansh Sherk Carol 03/16/2014, 11:50 AM

## 2014-03-17 LAB — VITAMIN B12: Vitamin B-12: 157 pg/mL — ABNORMAL LOW (ref 211–911)

## 2014-03-17 LAB — FOLATE: FOLATE: 7 ng/mL

## 2014-03-17 LAB — CBC
HEMATOCRIT: 25 % — AB (ref 36.0–46.0)
HEMATOCRIT: 24.1 % — AB (ref 36.0–46.0)
HEMOGLOBIN: 7.5 g/dL — AB (ref 12.0–15.0)
Hemoglobin: 7.8 g/dL — ABNORMAL LOW (ref 12.0–15.0)
MCH: 24.4 pg — ABNORMAL LOW (ref 26.0–34.0)
MCH: 24.8 pg — AB (ref 26.0–34.0)
MCHC: 31.2 g/dL (ref 30.0–36.0)
MCHC: 31.1 g/dL (ref 30.0–36.0)
MCV: 78.1 fL (ref 78.0–100.0)
MCV: 79.5 fL (ref 78.0–100.0)
Platelets: 275 10*3/uL (ref 150–400)
Platelets: 239 10*3/uL (ref 150–400)
RBC: 3.2 MIL/uL — AB (ref 3.87–5.11)
RBC: 3.03 MIL/uL — AB (ref 3.87–5.11)
RDW: 15.6 % — ABNORMAL HIGH (ref 11.5–15.5)
RDW: 15.9 % — ABNORMAL HIGH (ref 11.5–15.5)
WBC: 12.1 10*3/uL — ABNORMAL HIGH (ref 4.0–10.5)
WBC: 10.9 10*3/uL — ABNORMAL HIGH (ref 4.0–10.5)

## 2014-03-17 LAB — BASIC METABOLIC PANEL
BUN: 15 mg/dL (ref 6–23)
CHLORIDE: 102 meq/L (ref 96–112)
CO2: 25 meq/L (ref 19–32)
Calcium: 8.2 mg/dL — ABNORMAL LOW (ref 8.4–10.5)
Creatinine, Ser: 0.73 mg/dL (ref 0.50–1.10)
GFR calc Af Amer: >90 mL/min (ref >90)
GFR calc non Af Amer: 85 mL/min — ABNORMAL LOW (ref >90)
GLUCOSE: 121 mg/dL — AB (ref 70–99)
POTASSIUM: 3.9 meq/L (ref 3.7–5.3)
SODIUM: 139 meq/L (ref 137–147)

## 2014-03-17 LAB — IRON AND TIBC: UIBC: 313 ug/dL (ref 125–400)

## 2014-03-17 MED ORDER — PNEUMOCOCCAL VAC POLYVALENT 25 MCG/0.5ML IJ INJ
0.5000 mL | INJECTION | INTRAMUSCULAR | Status: AC
  Administered 2014-03-18: 0.5 mL via INTRAMUSCULAR
  Filled 2014-03-17 (×2): qty 0.5

## 2014-03-17 NOTE — Progress Notes (Signed)
Physical Therapy Treatment Patient Details Name: Leah Warner MRN: [id number] DOB: October 15, 1945 Today's Date: 03/17/2014    History of Present Illness 68 y.o. female s/p left TKA with a 50% weight bearing status.    PT Comments    Pt able to negotiate 1 step with RW with good adherence to 50% PWB on L LE.  Continue to recommend HHPT with husband support.  Follow Up Recommendations  Home health PT;Supervision for mobility/OOB     Equipment Recommendations  None recommended by PT    Recommendations for Other Services OT consult     Precautions / Restrictions Precautions Precautions: Knee Precaution Comments: Reviewed knee precautions and use of footsie roll Restrictions Weight Bearing Restrictions: Yes LLE Weight Bearing: Partial weight bearing LLE Partial Weight Bearing Percentage or Pounds: 50%    Mobility  Bed Mobility Overal bed mobility: Needs Assistance Bed Mobility: Sit to Supine     Supine to sit: HOB elevated;Min guard Sit to supine: Min assist   General bed mobility comments: A to get L LE up into bed  Transfers Overall transfer level: Needs assistance Equipment used: Rolling walker (2 wheeled) Transfers: Sit to/from Stand Sit to Stand: Min guard         General transfer comment: good use of UE for transfers  Ambulation/Gait Ambulation/Gait assistance: Min guard Ambulation Distance (Feet): 80 Feet Assistive device: Rolling walker (2 wheeled) Gait Pattern/deviations: Step-to pattern;Decreased stance time - left;Decreased step length - right;Antalgic Gait velocity: decreased Gait velocity interpretation: Below normal speed for age/gender General Gait Details: Pt doing well with following 50% WB on L LE   Stairs Stairs: Yes Stairs assistance: Supervision Stair Management: With walker Number of Stairs: 1 General stair comments: Pt and husband state they only have to go up 1 step.  Demonstrated use of backwards technique with ascent and forwards for  descent.  Pt able to return demonstrate with S.  Wheelchair Mobility    Modified Rankin (Stroke Patients Only)       Balance Overall balance assessment: Needs assistance Sitting-balance support: No upper extremity supported Sitting balance-Leahy Scale: Good     Standing balance support: Bilateral upper extremity supported Standing balance-Leahy Scale: Fair                      Cognition Arousal/Alertness: Awake/alert Behavior During Therapy: WFL for tasks assessed/performed Overall Cognitive Status: Within Functional Limits for tasks assessed                      Exercises Total Joint Exercises Ankle Circles/Pumps: AROM;Both;10 reps;Supine Quad Sets: AROM;Left;Supine;10 reps Heel Slides: AROM;Left;10 reps;Supine Long Arc Quad: AROM;Left;10 reps;Seated Knee Flexion: AROM;Left;10 reps;Seated    General Comments General comments (skin integrity, edema, etc.): Pt on room air upon arrival with o2 sats 90%.  Dropped to 88% and had pt use incentive spirometer and it was 95% when PT left room.  Pt and husband educated on deep breathing and use of spirometer.      Pertinent Vitals/Pain 5-6/10 L knee- nursing informed    Home Living Family/patient expects to be discharged to:: Private residence Living Arrangements: Spouse/significant other Available Help at Discharge: Family;Available 24 hours/day Type of Home: House Home Access: Stairs to enter Entrance Stairs-Rails: None Home Layout: Two level;Laundry or work area in basement;Able to live on main level with bedroom/bathroom Home Equipment: Environmental consultantWalker - 2 wheels;Bedside commode;Shower seat - built in      Prior Function Level of Independence: Independent  PT Goals (current goals can now be found in the care plan section) Acute Rehab PT Goals Patient Stated Goal: to return to work  PT Goal Formulation: With patient Time For Goal Achievement: 03/23/14 Potential to Achieve Goals: Good Progress  towards PT goals: Progressing toward goals    Frequency  7X/week    PT Plan Current plan remains appropriate    Co-evaluation             End of Session Equipment Utilized During Treatment: Gait belt Activity Tolerance: Patient tolerated treatment well Patient left: in bed;with call bell/phone within reach;with family/visitor present     Time: 0250-0317 PT Time Calculation (min): 27 min  Charges:  $Gait Training: 23-37 mins $Therapeutic Exercise: 8-22 mins                    G Codes:      Newbern,KAREN LUBECK 03/17/2014, 3:32 PM

## 2014-03-17 NOTE — Evaluation (Signed)
Occupational Therapy Evaluation Patient Details Name: Leah PatellaMarie Raben MRN: [id number] DOB: 04-15-1946 Today's Date: 03/17/2014    History of Present Illness 68 y.o. female s/p left TKA with a 50% weight bearing status.   Clinical Impression   Pt admitted with the above diagnoses and presents with below problem list. Pt will benefit from continued acute OT to address the below listed deficits and maximize independence with basic ADLs prior to d/c home with 24 hour supervision/assistance. PTA pt was independent with ADLs. Pt is currently at min guard assist level for ADLs. Pt's spouse able to provide 24 hour assistance. OT to continue to follow.     Follow Up Recommendations  Supervision/Assistance - 24 hour;No OT follow up    Equipment Recommendations  None recommended by OT    Recommendations for Other Services       Precautions / Restrictions Precautions Precautions: Knee Precaution Comments: Reviewed knee precautions and use of footsie roll Restrictions Weight Bearing Restrictions: Yes LLE Weight Bearing: Partial weight bearing LLE Partial Weight Bearing Percentage or Pounds: 50%      Mobility Bed Mobility Overal bed mobility: Needs Assistance Bed Mobility: Supine to Sit     Supine to sit: HOB elevated;Min guard     General bed mobility comments: pt in recliner  Transfers Overall transfer level: Needs assistance Equipment used: Rolling walker (2 wheeled) Transfers: Sit to/from Stand Sit to Stand: Min guard         General transfer comment: Min guard for safety from lowest bed setting. VCs for hand and foot placement. Instructed to prevent >50% WB through RLE upon standing. Verbalizes understanding    Balance Overall balance assessment: Needs assistance Sitting-balance support: No upper extremity supported;Feet supported Sitting balance-Leahy Scale: Good     Standing balance support: Bilateral upper extremity supported;During functional activity Standing  balance-Leahy Scale: Fair                              ADL Overall ADL's : Needs assistance/impaired Eating/Feeding: Set up;Sitting   Grooming: Set up;Standing;Sitting   Upper Body Bathing: Set up;Sitting   Lower Body Bathing: Min guard;Sit to/from stand;With adaptive equipment   Upper Body Dressing : Set up;Sitting   Lower Body Dressing: Min guard;With adaptive equipment;Sit to/from stand   Toilet Transfer: Min guard;Ambulation;RW (3n1 over toilet)   Toileting- Clothing Manipulation and Hygiene: Min guard;Sitting/lateral lean   Tub/ Shower Transfer: Min guard;Ambulation;3 in 1;Rolling walker   Functional mobility during ADLs: Min guard;Rolling walker General ADL Comments: Educated pt on techniques and AE for safe completion of ADLs while observing knee precautions and PWB.     Vision                     Perception     Praxis      Pertinent Vitals/Pain 3/10 pain in L knee.     Hand Dominance Right   Extremity/Trunk Assessment Upper Extremity Assessment Upper Extremity Assessment: Overall WFL for tasks assessed   Lower Extremity Assessment Lower Extremity Assessment: Defer to PT evaluation       Communication Communication Communication: No difficulties   Cognition Arousal/Alertness: Awake/alert Behavior During Therapy: WFL for tasks assessed/performed Overall Cognitive Status: Within Functional Limits for tasks assessed                     General Comments       Exercises Exercises: Total Joint     Shoulder  Instructions      Home Living Family/patient expects to be discharged to:: Private residence Living Arrangements: Spouse/significant other Available Help at Discharge: Family;Available 24 hours/day Type of Home: House Home Access: Stairs to enter Entergy CorporationEntrance Stairs-Number of Steps: 2 Entrance Stairs-Rails: None Home Layout: Two level;Laundry or work area in basement;Able to live on main level with  bedroom/bathroom Alternate Teacher, musicLevel Stairs-Number of Steps: "full flight" Alternate Level Stairs-Rails: Right;Left;Can reach both Bathroom Shower/Tub: Producer, television/film/videoWalk-in shower   Bathroom Toilet: Handicapped height Bathroom Accessibility: Yes How Accessible: Accessible via walker Home Equipment: Walker - 2 wheels;Bedside commode;Shower seat - built in          Prior Functioning/Environment Level of Independence: Independent             OT Diagnosis: Acute pain   OT Problem List: Decreased range of motion;Decreased activity tolerance;Impaired balance (sitting and/or standing);Decreased knowledge of use of DME or AE;Decreased knowledge of precautions;Pain   OT Treatment/Interventions: Self-care/ADL training;Therapeutic exercise;DME and/or AE instruction;Therapeutic activities;Patient/family education;Balance training    OT Goals(Current goals can be found in the care plan section) Acute Rehab OT Goals Patient Stated Goal: to return to work  OT Goal Formulation: With patient Time For Goal Achievement: 03/24/14 Potential to Achieve Goals: Good ADL Goals Pt Will Perform Lower Body Bathing: with supervision;with adaptive equipment;sit to/from stand Pt Will Perform Lower Body Dressing: with supervision;with adaptive equipment;sit to/from stand Pt Will Transfer to Toilet: with supervision;ambulating (3n1 over toilet) Pt Will Perform Toileting - Clothing Manipulation and hygiene: with supervision;sit to/from stand;sitting/lateral leans Pt Will Perform Tub/Shower Transfer: with supervision;ambulating;3 in 1;rolling walker  OT Frequency: Min 3X/week   Barriers to D/C:            Co-evaluation              End of Session Equipment Utilized During Treatment: Gait belt;Rolling walker Nurse Communication: Other (comment) (oxygen status)  Activity Tolerance: Patient tolerated treatment well Patient left: in chair;with call bell/phone within reach   Time: 1359-1425 OT Time Calculation  (min): 26 min Charges:  OT General Charges $OT Visit: 1 Procedure OT Evaluation $Initial OT Evaluation Tier I: 1 Procedure OT Treatments $Self Care/Home Management : 8-22 mins G-Codes:    Pilar GrammesMathews, Kathryn H 03/17/2014, 2:41 PM

## 2014-03-17 NOTE — Progress Notes (Signed)
Physical Therapy Treatment Patient Details Name: Leah Warner MRN: [id number] DOB: 1946-08-22 Today's Date: 03/17/2014    History of Present Illness 68 y.o. female s/p left TKA with a 50% weight bearing status.    PT Comments    Patient is progressing well towards physical therapy goals, ambulating up to 55 feet with min guard for safety using rolling walker. Patient will continue to benefit from skilled physical therapy services to further improve independence with functional mobility. Plan for stair training this afternoon as pt is able.    Follow Up Recommendations  Home health PT;Supervision for mobility/OOB     Equipment Recommendations  None recommended by PT    Recommendations for Other Services OT consult     Precautions / Restrictions Precautions Precautions: Knee Precaution Comments: Reviewed knee precautions and use of footsie roll Restrictions Weight Bearing Restrictions: Yes LLE Weight Bearing: Partial weight bearing LLE Partial Weight Bearing Percentage or Pounds: 50%    Mobility  Bed Mobility Overal bed mobility: Needs Assistance Bed Mobility: Supine to Sit     Supine to sit: HOB elevated;Min guard     General bed mobility comments: Min guard for safety. Pt did not need physical assist this AM however HOB was elevated. VCs for technique.  Transfers Overall transfer level: Needs assistance Equipment used: Rolling walker (2 wheeled) Transfers: Sit to/from Stand Sit to Stand: Min guard         General transfer comment: Min guard for safety from lowest bed setting. VCs for hand and foot placement. Instructed to prevent >50% WB through RLE upon standing. Verbalizes understanding  Ambulation/Gait Ambulation/Gait assistance: Hydrographic surveyorMin guard Ambulation Distance (Feet): 55 Feet Assistive device: Rolling walker (2 wheeled) Gait Pattern/deviations: Step-to pattern;Decreased step length - right;Decreased stance time - left;Antalgic   Gait velocity  interpretation: Below normal speed for age/gender General Gait Details: Demonstrating good UE strength to maintain 50% WB through LLE. VCs for RW placement and sequencing, needed intermittently throughout ambulatory distance. No loss of balance or physical assist required to correct pt.   Stairs            Wheelchair Mobility    Modified Rankin (Stroke Patients Only)       Balance                                    Cognition Arousal/Alertness: Awake/alert Behavior During Therapy: WFL for tasks assessed/performed Overall Cognitive Status: Within Functional Limits for tasks assessed                      Exercises Total Joint Exercises Ankle Circles/Pumps: AROM;Both;10 reps;Seated Quad Sets: AROM;Left;10 reps;Seated Long Arc Quad: AROM;Left;10 reps;Seated Knee Flexion: AROM;Left;10 reps;Seated    General Comments        Pertinent Vitals/Pain 2/10 pain Patient repositioned in chair for comfort.     Home Living                      Prior Function            PT Goals (current goals can now be found in the care plan section) Acute Rehab PT Goals PT Goal Formulation: With patient Time For Goal Achievement: 03/23/14 Potential to Achieve Goals: Good Progress towards PT goals: Progressing toward goals    Frequency  7X/week    PT Plan Current plan remains appropriate    Co-evaluation  End of Session   Activity Tolerance: Patient tolerated treatment well Patient left: in chair;with call bell/phone within reach;with family/visitor present;with nursing/sitter in room     Time: 1112-1140 PT Time Calculation (min): 28 min  Charges:  $Gait Training: 8-22 mins $Therapeutic Exercise: 8-22 mins                    G Codes:      BJ's WholesaleLogan Secor Barbour, South CarolinaPT [phone]  [place]erton MountBarbour, Logan S 03/17/2014, 1:33 PM

## 2014-03-17 NOTE — Care Management Note (Signed)
CARE MANAGEMENT NOTE 03/17/2014  Patient:  Leah Warner,Leah   Account Number:  [id number]  Date Initiated:  03/17/2014  Documentation initiated by:  Vance PeperBRADY,SUSAN  Subjective/Objective Assessment:   68 yr old female s/p left total knee arthroplasty.     Action/Plan:   Case manager spoke to patient and husband concerning home health and DME needs at discharge. Choice offered. [facility] DME has been delivered.   Anticipated DC Date:  03/18/2014   Anticipated DC Plan:  HOME W HOME HEALTH SERVICES      DC Planning Services  CM consult      [facility]  [facility] HEALTH  DURABLE MEDICAL EQUIPMENT   Choice offered to / List presented to:  C-1 Patient   DME arranged  [facility]  3-N-1  CPM      DME agency  [employer]     HH arranged  HH[facility] PT      [facility]   Status of service:  Completed, signed off Medicare Important Message given?  NA - LOS <3 / Initial given by admissions (If response is "NO", the following Medicare IM given date fields will be blank) Date Medicare IM given:  03/10/2014

## 2014-03-17 NOTE — Progress Notes (Addendum)
Patient ID: Jeannene PatellaMarie Segoviano, female   DOB: [birth year], 68 y.o.   MRN: [id number] PATIENT ID: Jeannene PatellaMarie Que        MRN:  [id number]          DOB/AGE: [birth year] / 68 y.o.    Norlene CampbellPeter Whitfield, MD   Jacqualine CodeBrian Petrarca, PA-C [address] [place], [place]  [address]                             [phone]   PROGRESS NOTE  Subjective:  negative for Chest Pain  negative for Shortness of Breath  negative for Nausea/Vomiting   negative for Calf Pain    Tolerating Diet: yes         Patient reports pain as mild and moderate.     Rough day yesterday.  Better today   Objective: Vital signs in last 24 hours:   Patient Vitals for the past 24 hrs:  BP Temp Temp src Pulse Resp SpO2 Height Weight  03/17/14 0603 118/60 mmHg 97.7 F (36.5 C) Oral 90 16 98 % - -  03/17/14 0400 - - - - 16 98 % - -  03/17/14 0300 - - - - - - 5\' 1"  (1.549 m) 93.895 kg (207 lb)  03/17/14 0213 109/55 mmHg 97.8 F (36.6 C) Oral 88 16 99 % - -  03/17/14 0000 - - - - 16 99 % - -  03/16/14 2150 128/57 mmHg 98.8 F (37.1 C) Oral 86 16 97 % - -  03/16/14 2000 - - - - 16 99 % - -  03/16/14 1836 149/92 mmHg 98.1 F (36.7 C) - 80 16 97 % - -  03/16/14 1556 - - - - 16 94 % - -  03/16/14 1230 150/53 mmHg 98.2 F (36.8 C) - 97 13 93 % - -  03/16/14 1215 130/52 mmHg - - 98 17 93 % - -  03/16/14 1200 126/52 mmHg - - 97 13 92 % - -  03/16/14 1145 134/51 mmHg - - 101 11 97 % - -  03/16/14 1130 143/52 mmHg - - 100 16 94 % - -  03/16/14 1115 144/54 mmHg - - 100 13 98 % - -  03/16/14 1100 154/62 mmHg - - 96 14 95 % - -  03/16/14 1045 158/49 mmHg - - 92 10 94 % - -  03/16/14 1030 156/57 mmHg - - 88 13 99 % - -  03/16/14 1015 144/57 mmHg - - 90 16 94 % - -  03/16/14 1000 147/68 mmHg 97.5 F (36.4 C) - 89 12 93 % - -      Intake/Output from previous day:   06/23 0701 - 06/24 0700 In: 1932.8 [P.O.:720; I.V.:1212.8] Out: 1475 [Urine:1075; Drains:200]   Intake/Output this shift:       Intake/Output     06/23 0701 - 06/24 0700  06/24 0701 - 06/25 0700   P.O. 720    I.V. (mL/kg) 1212.8 (12.9)    Total Intake(mL/kg) 1932.8 (20.6)    Urine (mL/kg/hr) 1075 (0.5)    Drains 200 (0.1)    Blood 200 (0.1)    Total Output 1475     Net +457.8             LABORATORY DATA:  Recent Labs  03/10/14 1056 03/17/14 0540  WBC 6.7 12.1*  HGB 9.5* 7.8*  HCT 30.3* 25.0*  PLT 298 275    Recent Labs  03/10/14  1056 03/17/14 0540  NA 144 139  K 4.9 3.9  CL 105 102  CO2 25 25  BUN 11 15  CREATININE 0.56 0.73  GLUCOSE 103* 121*  CALCIUM 9.4 8.2*   Lab Results  Component Value Date   INR 1.01 03/10/2014    Recent Radiographic Studies :  Chest 2 View  03/10/2014   CLINICAL DATA:  Preop left knee replacement  EXAM: CHEST  2 VIEW  COMPARISON:  None.  FINDINGS: The heart size and mediastinal contours are within normal limits. Both lungs are clear. The visualized skeletal structures are unremarkable.  IMPRESSION: No active cardiopulmonary disease.   Electronically Signed   By: Elige KoHetal  Patel   On: 03/10/2014 13:54     Examination:  General appearance: alert, cooperative, mild distress and moderate distress Resp: clear to auscultation bilaterally Cardio: regular rate and rhythm GI: normal findings: bowel sounds normal  Wound Exam: clean, dry, intact dressing  Drainage:  None: Hemovac 50 ml last shift  Motor Exam: EHL, FHL, Anterior Tibial and Posterior Tibial Intact  Sensory Exam: Superficial Peroneal, Deep Peroneal and Tibial normal  Vascular Exam: Left posterior tibial artery has 1+ (weak) pulse  Assessment:    1 Day Post-Op  Procedure(s) (LRB): TOTAL KNEE ARTHROPLASTY (Left)  ADDITIONAL DIAGNOSIS:  Active Problems:   S/P total knee replacement using cement  Acute Blood Loss Anemia   Plan: Physical Therapy as ordered Partial Weight Bearing @ 50% (PWB)  DVT Prophylaxis:  Xarelto, Foot Pumps and TED hose  DISCHARGE PLAN: Home  DISCHARGE NEEDS: HHPT, CPM, Walker and 3-in-1 comode seat  Will  recheck CBC at 12:30pm.  May need PRBC.  Will check FE, TIBC, Folate & B12 levels       PETRARCA,BRIAN 03/17/2014 8:08 AM

## 2014-03-18 ENCOUNTER — Encounter (HOSPITAL_COMMUNITY): Payer: Self-pay | Admitting: Orthopaedic Surgery

## 2014-03-18 DIAGNOSIS — D519 Vitamin B12 deficiency anemia, unspecified: Secondary | ICD-10-CM | POA: Diagnosis present

## 2014-03-18 DIAGNOSIS — D509 Iron deficiency anemia, unspecified: Secondary | ICD-10-CM

## 2014-03-18 DIAGNOSIS — M1712 Unilateral primary osteoarthritis, left knee: Secondary | ICD-10-CM | POA: Diagnosis present

## 2014-03-18 LAB — BASIC METABOLIC PANEL
BUN: 11 mg/dL (ref 6–23)
CO2: 27 mEq/L (ref 19–32)
CREATININE: 0.56 mg/dL (ref 0.50–1.10)
Calcium: 8.2 mg/dL — ABNORMAL LOW (ref 8.4–10.5)
Chloride: 101 mEq/L (ref 96–112)
GFR calc Af Amer: >90 mL/min (ref >90)
GLUCOSE: 121 mg/dL — AB (ref 70–99)
Potassium: 3.7 mEq/L (ref 3.7–5.3)
Sodium: 139 mEq/L (ref 137–147)

## 2014-03-18 LAB — CBC
HCT: 23.7 % — ABNORMAL LOW (ref 36.0–46.0)
HEMATOCRIT: 22 % — AB (ref 36.0–46.0)
Hemoglobin: 6.8 g/dL — CL (ref 12.0–15.0)
Hemoglobin: 7.4 g/dL — ABNORMAL LOW (ref 12.0–15.0)
MCH: 24.6 pg — ABNORMAL LOW (ref 26.0–34.0)
MCH: 24.5 pg — AB (ref 26.0–34.0)
MCHC: 30.9 g/dL (ref 30.0–36.0)
MCHC: 31.2 g/dL (ref 30.0–36.0)
MCV: 79.7 fL (ref 78.0–100.0)
MCV: 78.5 fL (ref 78.0–100.0)
Platelets: 237 10*3/uL (ref 150–400)
Platelets: 241 10*3/uL (ref 150–400)
RBC: 2.76 MIL/uL — ABNORMAL LOW (ref 3.87–5.11)
RBC: 3.02 MIL/uL — ABNORMAL LOW (ref 3.87–5.11)
RDW: 15.8 % — ABNORMAL HIGH (ref 11.5–15.5)
RDW: 15.8 % — ABNORMAL HIGH (ref 11.5–15.5)
WBC: 8.3 10*3/uL (ref 4.0–10.5)
WBC: 10.3 10*3/uL (ref 4.0–10.5)

## 2014-03-18 MED ORDER — METHOCARBAMOL 500 MG PO TABS: 500.0000 mg | ORAL_TABLET | Freq: Three times a day (TID) | ORAL | Status: AC | PRN

## 2014-03-18 MED ORDER — ACETAMINOPHEN 325 MG PO TABS: 650.0000 mg | ORAL_TABLET | Freq: Four times a day (QID) | ORAL | Status: AC | PRN

## 2014-03-18 MED ORDER — FERROUS SULFATE 325 (65 FE) MG PO TABS
325.0000 mg | ORAL_TABLET | Freq: Two times a day (BID) | ORAL | Status: DC
  Filled 2014-03-18 (×2): qty 1

## 2014-03-18 MED ORDER — FERROUS SULFATE 325 (65 FE) MG PO TABS: 325.0000 mg | ORAL_TABLET | Freq: Two times a day (BID) | ORAL | Status: AC

## 2014-03-18 MED ORDER — CYANOCOBALAMIN 1000 MCG/ML IJ SOLN
1000.0000 ug | Freq: Once | INTRAMUSCULAR | Status: AC
  Administered 2014-03-18: 1000 ug via INTRAMUSCULAR
  Filled 2014-03-18 (×2): qty 1

## 2014-03-18 MED ORDER — OXYCODONE HCL 5 MG PO TABS: 5.0000 mg | ORAL_TABLET | ORAL | Status: AC | PRN

## 2014-03-18 MED ORDER — RIVAROXABAN 10 MG PO TABS: 10.0000 mg | ORAL_TABLET | ORAL | Status: AC

## 2014-03-18 NOTE — Progress Notes (Signed)
Patient ID: Leah Warner, female   DOB: 1946-02-09, 68 y.o.   MRN: [id number] PATIENT ID: Leah Warner        MRN:  [id number]          DOB/AGE: 1946-02-09 / 68 y.o.    Leah CampbellPeter Katryn Plummer, MD   Leah CodeBrian Petrarca, PA-C [address] [place], [place]  [address]                             [phone]   PROGRESS NOTE  Subjective:  negative for Chest Pain  negative for Shortness of Breath  negative for Nausea/Vomiting   negative for Calf Pain    Tolerating Diet: yes         Patient reports pain as mild.     Without SOB or chest pain, no dizzyness when OOB  Objective: Vital signs in last 24 hours:   Patient Vitals for the past 24 hrs:  BP Temp Temp src Pulse Resp SpO2  03/18/14 1141 150/62 mmHg 99.3 F (37.4 C) - 121 18 92 %  03/18/14 0457 149/70 mmHg 98.3 F (36.8 C) - 117 18 93 %  03/17/14 2035 149/65 mmHg 98.3 F (36.8 C) - 120 18 97 %  03/17/14 1600 - - - - 18 98 %  03/17/14 1356 130/61 mmHg 98.2 F (36.8 C) Oral 91 16 100 %  03/17/14 1200 - - - - 18 98 %      Intake/Output from previous day:   06/24 0701 - 06/25 0700 In: 600 [P.O.:600] Out: -    Intake/Output this shift:       Intake/Output     06/24 0701 - 06/25 0700 06/25 0701 - 06/26 0700   P.O. 600    I.V. (mL/kg)     Total Intake(mL/kg) 600 (6.4)    Urine (mL/kg/hr)     Drains     Blood     Total Output       Net +600          Urine Occurrence 2 x       LABORATORY DATA:  Recent Labs  03/17/14 0540 03/17/14 1205 03/18/14 0415 03/18/14 1110  WBC 12.1* 10.9* 8.3 10.3  HGB 7.8* 7.5* 6.8* 7.4*  HCT 25.0* 24.1* 22.0* 23.7*  PLT 275 239 237 241    Recent Labs  03/17/14 0540 03/18/14 0415  NA 139 139  K 3.9 3.7  CL 102 101  CO2 25 27  BUN 15 11  CREATININE 0.73 0.56  GLUCOSE 121* 121*  CALCIUM 8.2* 8.2*   Lab Results  Component Value Date   INR 1.01 03/10/2014    Recent Radiographic Studies :  Chest 2 View  03/10/2014   CLINICAL DATA:  Preop left knee replacement  EXAM: CHEST  2 VIEW   COMPARISON:  None.  FINDINGS: The heart size and mediastinal contours are within normal limits. Both lungs are clear. The visualized skeletal structures are unremarkable.  IMPRESSION: No active cardiopulmonary disease.   Electronically Signed   By: Leah KoHetal  Warner   On: 03/10/2014 13:54     Examination:  General appearance: alert and no distress  Wound Exam: clean, dry, intact   Drainage:  None: wound tissue dry  Motor Exam: EHL, FHL, Anterior Tibial and Posterior Tibial Intact  Sensory Exam: Superficial Peroneal, Deep Peroneal and Tibial normal  Vascular Exam: Normal  Assessment:    2 Days Post-Op  Procedure(s) (LRB): TOTAL KNEE ARTHROPLASTY (Left)  ADDITIONAL DIAGNOSIS:  Principal Problem:   Osteoarthritis of left knee Active Problems:   S/P total knee replacement using cement   Iron deficiency anemia, unspecified   Anemia, B12 deficiency  Acute Blood Loss Anemia-asymptomatic-repeat HGB at 7.4-no change from yesterday   Plan: Physical Therapy as ordered Partial Weight Bearing @ 50% (PWB)  DVT Prophylaxis:  Xarelto and TED hose  DISCHARGE PLAN: Home  DISCHARGE NEEDS: HHPT, CPM, Walker and 3-in-1 comode seat   will plan on D/C today-has iron deficiency anemia and B12 deficiency chronically-will give Rx for iron and B12 injection prior to D/C.father/U with primary care, dressing changed-no calf pain     Leah Warner W 03/18/2014 11:45 AM

## 2014-03-18 NOTE — Anesthesia Postprocedure Evaluation (Signed)
  Anesthesia Post-op Note  Patient: Leah Warner  Procedure(s) Performed: Procedure(s): TOTAL KNEE ARTHROPLASTY (Left)  Patient Location: PACU  Anesthesia Type:General  Level of Consciousness: awake, alert  and oriented  Airway and Oxygen Therapy: Patient Spontanous Breathing and Patient connected to nasal cannula oxygen  Post-op Pain: mild  Post-op Assessment: Post-op Vital signs reviewed, Patient's Cardiovascular Status Stable, Respiratory Function Stable, Patent Airway and Pain level controlled  Post-op Vital Signs: stable  Last Vitals:  Filed Vitals:   03/18/14 1147  BP:   Pulse: 114  Temp:   Resp:     Complications: No apparent anesthesia complications

## 2014-03-18 NOTE — Discharge Summary (Signed)
Norlene Campbell, MD   Jacqualine Code, PA-C [address] [place], [place]  [address]                             [phone]  PATIENT ID: Leah Warner        MRN:  [id number]          DOB/AGE: 69-Jan-1947 / 68 y.o.    DISCHARGE SUMMARY  ADMISSION DATE:    03/16/2014 DISCHARGE DATE:   03/18/2014   ADMISSION DIAGNOSIS: LEFT KNEE OSTEOARTHRITIS    DISCHARGE DIAGNOSIS:  LEFT KNEE OSTEOARTHRITIS    ADDITIONAL DIAGNOSIS: Principal Problem:   Osteoarthritis of left knee Active Problems:   S/P total knee replacement using cement   Iron deficiency anemia, unspecified   Anemia, B12 deficiency  Past Medical History  Diagnosis Date  . Hypertension     takes Lisinopril daily  . History of bronchitis     > 38yrs ago  . Arthritis   . Joint pain   . History of gout     nothing in over 2yrs  . GERD (gastroesophageal reflux disease)     when eats to late at night and lays down.No meds  . 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 admitted on 03/16/2014 and found to have a diagnosis of LEFT KNEE OSTEOARTHRITIS.  After appropriate laboratory studies were obtained  they were taken to the operating room on 03/16/2014 and underwent  Procedure(s): TOTAL KNEE ARTHROPLASTY  Left.   They were given perioperative antibiotics:  Anti-infectives   Start     Dose/Rate Route Frequency Ordered Stop   03/16/14 1300  ceFAZolin (ANCEF) IVPB 2 g/50 mL premix     2 g 100 mL/hr over 30 Minutes Intravenous Every 6 hours 03/16/14 1257 03/16/14 1930   03/16/14 0600  ceFAZolin (ANCEF) IVPB 2 g/50 mL premix     2 g 100 mL/hr over 30 Minutes Intravenous On call to O.R. 03/15/14 1252 03/16/14 0723    .  Tolerated the procedure well.  Placed with a foley intraoperatively.     Toradol was given post op.  POD #1, allowed out of  bed to a chair.  PT for ambulation and exercise program.  Foley D/C'd in morning.  IV saline locked.  O2 discontionued. Hemovac pulled.  POD #2, continued PT and ambulation.  . .  The remainder of the hospital course was dedicated to ambulation and strengthening.   The patient was discharged on 2 Days Post-Op in  Stable condition.  Blood products given:none  DIAGNOSTIC STUDIES: Recent vital signs:  Patient Vitals for the past 24 hrs:  BP Temp Temp src Pulse Resp SpO2  03/18/14 1141 150/62 mmHg 99.3 F (37.4 C) - 121 18 92 %  03/18/14 0457 149/70 mmHg 98.3 F (36.8 C) - 117 18 93 %  03/17/14 2035 149/65 mmHg 98.3 F (36.8 C) - 120 18 97 %  03/17/14 1600 - - - - 18 98 %  03/17/14 1356 130/61 mmHg 98.2 F (36.8 C) Oral 91 16 100 %  03/17/14 1200 - - - - 18 98 %       Recent laboratory studies:  Recent Labs  03/17/14 0540 03/17/14 1205 03/18/14 0415 03/18/14 1110  WBC 12.1* 10.9* 8.3 10.3  HGB 7.8* 7.5* 6.8* 7.4*  HCT 25.0* 24.1* 22.0* 23.7*  PLT 275 239 237 241    Recent Labs  03/17/14 0540 03/18/14 0415  NA 139 139  K 3.9 3.7  CL 102 101  CO2 25 27  BUN 15 11  CREATININE 0.73 0.56  GLUCOSE 121* 121*  CALCIUM 8.2* 8.2*   Lab Results  Component Value Date   INR 1.01 03/10/2014     Recent Radiographic Studies :  Chest 2 View  03/10/2014   CLINICAL DATA:  Preop left knee replacement  EXAM: CHEST  2 VIEW  COMPARISON:  None.  FINDINGS: The heart size and mediastinal contours are within normal limits. Both lungs are clear. The visualized skeletal structures are unremarkable.  IMPRESSION: No active cardiopulmonary disease.   Electronically Signed   By: Elige KoHetal  Patel   On: 03/10/2014 13:54    DISCHARGE INSTRUCTIONS:     Discharge Instructions   CPM    Complete by:  As directed   Continuous passive motion machine (CPM):      Use the CPM from 0 to 60 for 6-8 hours per day.      You may increase by 5-10 per day.  You may break it up into 2 or 3 sessions per day.       Use CPM for 3-4 weeks or until you are told to stop.     Call MD / Call 911    Complete by:  As directed   If you experience chest pain or shortness of breath, CALL 911 and be transported to the hospital emergency room.  If you develop a fever above 101 F, pus (white drainage) or increased drainage or redness at the wound, or calf pain, call your surgeon's office.     Change dressing    Complete by:  As directed   Change dressing on SUNDAY, then change the dressing daily with sterile 4 x 4 inch gauze dressing and apply TED hose.  You may clean the incision with alcohol prior to redressing.     Constipation Prevention    Complete by:  As directed   Drink plenty of fluids.  Prune juice and/or coffee may be helpful.  You may use a stool softener, such as Colace (over the counter) 100 mg twice a day.  Use MiraLax (over the counter) for constipation as needed but this may take several days to work.  Mag Citrate --OR-- Milk of Magnesia --OR -- Dulcolax pills/suppositories may also be used but follow directions on the label.     Diet general    Complete by:  As directed      Discharge instructions    Complete by:  As directed   YOU WERE GIVEN A DEVICE CALLED AN INCENTIVE SPIROMETER TO HELP YOU TAKE DEEP BREATHS.  PLEASE USE THIS AT LEAST TEN (10) TIMES EVERY 1-2 HOURS EVERY DAY TO PREVENT PNEUMONIA.     Do not put a pillow under the knee. Place it under the heel.    Complete by:  As directed      Driving restrictions    Complete by:  As directed   No driving for 6 weeks     Increase activity slowly as tolerated    Complete by:  As directed      Lifting restrictions    Complete by:  As directed   No lifting for 6 weeks     Partial weight bearing  Complete by:  As directed   50 % WEIGHT BEARING AS TAUGHT IN PHYSICAL THERAPY     Patient may shower    Complete by:  As directed   You may shower over the brown dressing.  Once the dressing is removed you may shower without a dressing once there  is no drainage.  Do not wash over the wound.  If drainage remains, cover wound with plastic wrap and then shower.     TED hose    Complete by:  As directed   Use stockings (TED hose) for 2 weeks on operative leg(s).  You may remove them at night for sleeping.           DISCHARGE MEDICATIONS:     Medication List         acetaminophen 325 MG tablet  Commonly known as:  TYLENOL  Take 2 tablets (650 mg total) by mouth every 6 (six) hours as needed for mild pain (or Fever >/= 101).     ferrous sulfate 325 (65 FE) MG tablet  Take 1 tablet (325 mg total) by mouth 2 (two) times daily with a meal.     lisinopril 40 MG tablet  Commonly known as:  PRINIVIL,ZESTRIL  Take 40 mg by mouth daily.     methocarbamol 500 MG tablet  Commonly known as:  ROBAXIN  Take 1 tablet (500 mg total) by mouth every 8 (eight) hours as needed for muscle spasms.     oxyCODONE 5 MG immediate release tablet  Commonly known as:  Oxy IR/ROXICODONE  Take 1-2 tablets (5-10 mg total) by mouth every 4 (four) hours as needed for moderate pain or severe pain.     rivaroxaban 10 MG Tabs tablet  Commonly known as:  XARELTO  Take 1 tablet (10 mg total) by mouth daily.        FOLLOW UP VISIT:   Follow-up Information   Follow up with Valeria BatmanWHITFIELD, PETER W, MD On 03/31/2014. ([facility] will contact you concerning start date and time for physical therapy.)    Specialty:  Orthopedic Surgery   Contact information:   [address] [place] [id number] [phone]       DISPOSITION:   Home  CONDITION:  Stable   Leah Warner,Leah Warner 03/18/2014, 11:57 AM

## 2014-03-18 NOTE — Progress Notes (Signed)
Physical Therapy Treatment Patient Details Name: Leah Warner MRN: [id number] DOB: 08/20/1946 Today's Date: 03/18/2014    History of Present Illness 68 y.o. female s/p left TKA with a 50% weight bearing status.    PT Comments    Patient is progressing well towards physical therapy goals, ambulating up to 80 feet with supervision. Stair training completed yesterday and pt reports she feels confident with this task, denies needing to practice again today. SpO2 maintained 88-92% throughout therapy session on room air. Patient will continue to benefit from skilled physical therapy services at home to further improve independence with functional mobility.    Follow Up Recommendations  Home health PT;Supervision for mobility/OOB     Equipment Recommendations  None recommended by PT    Recommendations for Other Services       Precautions / Restrictions Precautions Precautions: Knee Restrictions Weight Bearing Restrictions: Yes LLE Weight Bearing: Partial weight bearing LLE Partial Weight Bearing Percentage or Pounds: 50%    Mobility  Bed Mobility Overal bed mobility: Needs Assistance Bed Mobility: Supine to Sit     Supine to sit: Supervision     General bed mobility comments: Supervision for safety. VCs for technique. Requires extra time. HOB flat and no use of rails.  Transfers Overall transfer level: Needs assistance Equipment used: Rolling walker (2 wheeled) Transfers: Sit to/from Stand Sit to Stand: Min guard         General transfer comment: Correctly places UEs in safe position prior to standing. Requires extra time but no loss of balance or assist needed for safe transfer.  Ambulation/Gait Ambulation/Gait assistance: Supervision Ambulation Distance (Feet): 80 Feet Assistive device: Rolling walker (2 wheeled) Gait Pattern/deviations: Step-to pattern;Decreased step length - right;Decreased stance time - left;Antalgic   Gait velocity interpretation: Below  normal speed for age/gender General Gait Details: Continues to follow 50% WB status on LLE. VCs to continue using UEs for support on RW. VCs for increased right step length. SpO2 maintained 88% and higher during therapy session on room air.   Stairs            Wheelchair Mobility    Modified Rankin (Stroke Patients Only)       Balance                                    Cognition Arousal/Alertness: Awake/alert Behavior During Therapy: WFL for tasks assessed/performed Overall Cognitive Status: Within Functional Limits for tasks assessed                      Exercises Total Joint Exercises Ankle Circles/Pumps: AROM;Both;10 reps;Supine Long Arc Quad: AROM;Left;10 reps;Seated Knee Flexion: AROM;Left;10 reps;Seated    General Comments General comments (skin integrity, edema, etc.): Complains of mild left calf pain where LLE has been resting on foam. It is tender to palpation. Nurse notified. Pt states MD was aware and instructed her to use pillow in place of footsie roll. Educated pt to inform medical staff if this symptom worsens.      Pertinent Vitals/Pain Pt reports mild left calf pain - see general comments  Nurse notified Patient repositioned in chair for comfort.     Home Living                      Prior Function            PT Goals (current goals can now be found  in the care plan section) Acute Rehab PT Goals PT Goal Formulation: With patient Time For Goal Achievement: 03/23/14 Potential to Achieve Goals: Good Progress towards PT goals: Progressing toward goals    Frequency  7X/week    PT Plan Current plan remains appropriate    Co-evaluation             End of Session   Activity Tolerance: Patient tolerated treatment well Patient left: with call bell/phone within reach;with family/visitor present;in chair     Time: 1521-1535 PT Time Calculation (min): 14 min  Charges:  $Gait Training: 8-22  mins $Therapeutic Activity: 8-22 mins                    G Codes:      BJ's WholesaleLogan Secor [place]bour, [place]PT [phone]  [place] Mount[place]bour, Logan S 03/18/2014, 4:27 PM

## 2014-03-18 NOTE — Progress Notes (Signed)
Anesthesiology Follow-up:  Awake and alert in good spirits, breathing comfortably off oxygen. Pain control adequate tolerating  PT   VS: T-36.8 BP 149/70 RR-16 HR 110 regular O2 sat 96-99% on 2L  Doing well, tachycardia but otherwise feeling well. Should be ready for discharge today or tomorrow.   Leah Warner

## 2014-03-18 NOTE — Progress Notes (Addendum)
Patient ID: Leah Warner, female   DOB: 13-Dec-1945, 68 y.o.   MRN: [id number] PATIENT ID: Leah Warner        MRN:  [id number]          DOB/AGE: 69-Mar-1947 / 68 y.o.    Norlene CampbellPeter Whitfield, MD   Jacqualine CodeBrian Petrarca, PA-C [address] [place], [place]  [address]                             [phone]   PROGRESS NOTE  Subjective:  negative for Chest Pain  negative for Shortness of Breath  negative for Nausea/Vomiting   negative for Calf Pain    Tolerating Diet: yes         Patient reports pain as mild and moderate.     Pain better today  Objective: Vital signs in last 24 hours:   Patient Vitals for the past 24 hrs:  BP Temp Temp src Pulse Resp SpO2  03/18/14 0457 149/70 mmHg 98.3 F (36.8 C) - 117 18 93 %  03/17/14 2035 149/65 mmHg 98.3 F (36.8 C) - 120 18 97 %  03/17/14 1600 - - - - 18 98 %  03/17/14 1356 130/61 mmHg 98.2 F (36.8 C) Oral 91 16 100 %  03/17/14 1200 - - - - 18 98 %      Intake/Output from previous day:   06/24 0701 - 06/25 0700 In: 600 [P.O.:600] Out: -    Intake/Output this shift:       Intake/Output     06/24 0701 - 06/25 0700 06/25 0701 - 06/26 0700   P.O. 600    I.V. (mL/kg)     Total Intake(mL/kg) 600 (6.4)    Urine (mL/kg/hr)     Drains     Blood     Total Output       Net +600          Urine Occurrence 2 x       LABORATORY DATA:  Recent Labs  03/17/14 0540 03/17/14 1205 03/18/14 0415 03/18/14 1110  WBC 12.1* 10.9* 8.3 10.3  HGB 7.8* 7.5* 6.8* 7.4*  HCT 25.0* 24.1* 22.0* 23.7*  PLT 275 239 237 241    Recent Labs  03/17/14 0540 03/18/14 0415  NA 139 139  K 3.9 3.7  CL 102 101  CO2 25 27  BUN 15 11  CREATININE 0.73 0.56  GLUCOSE 121* 121*  CALCIUM 8.2* 8.2*   Lab Results  Component Value Date   INR 1.01 03/10/2014    Recent Radiographic Studies :  Chest 2 View  03/10/2014   CLINICAL DATA:  Preop left knee replacement  EXAM: CHEST  2 VIEW  COMPARISON:  None.  FINDINGS: The heart size and mediastinal contours are  within normal limits. Both lungs are clear. The visualized skeletal structures are unremarkable.  IMPRESSION: No active cardiopulmonary disease.   Electronically Signed   By: Elige KoHetal  Patel   On: 03/10/2014 13:54     Examination:  General appearance: alert, cooperative, mild distress and moderate distress Resp: clear to auscultation bilaterally Cardio: regular rate and rhythm GI: normal findings: bowel sounds normal  Wound Exam: clean, dry, intact   Drainage:  None: wound tissue dry  Motor Exam: EHL, FHL, Anterior Tibial and Posterior Tibial Intact  Sensory Exam: Superficial Peroneal, Deep Peroneal and Tibial normal  Vascular Exam: Left dorsalis pedis artery has 1+ (weak) pulse  Assessment:    2 Days Post-Op  Procedure(s) (LRB): TOTAL KNEE ARTHROPLASTY (Left)  ADDITIONAL DIAGNOSIS:  Principal Problem:   Osteoarthritis of left knee Active Problems:   S/P total knee replacement using cement   Iron deficiency anemia, unspecified   Anemia, B12 deficiency  Acute Blood Loss Anemia   Plan: Physical Therapy as ordered 50% weight bearing  DVT Prophylaxis:  Xarelto, Foot Pumps and TED hose  DISCHARGE PLAN: Home  DISCHARGE NEEDS: HHPT, CPM, Walker and 3-in-1 comode seat  B12 injection ordered, FeSO4 ordered  Plan D/C to home today         [facility] Found Hsp-AntiochETRARCA,BRIAN 03/18/2014 11:41 AM

## 2014-03-18 NOTE — Progress Notes (Signed)
Physical Therapy Treatment Patient Details Name: Leah PatellaMarie Warner MRN: [id number] DOB: 1946-02-21 Today's Date: 03/18/2014    History of Present Illness 68 y.o. female s/p left TKA with a 50% weight bearing status.    PT Comments    Patient SpO2 at 76% on room air at start of therapy. Improved to 96% with 3L supplemental O2. Ambulating up to 35 feet with supervision while using RW. HR to 141 while ambulating and SpO2 between 88-94% while ambulating. Patient will continue to benefit from skilled physical therapy services to further improve independence with functional mobility.   Follow Up Recommendations  Home health PT;Supervision for mobility/OOB     Equipment Recommendations  None recommended by PT    Recommendations for Other Services       Precautions / Restrictions Precautions Precautions: Knee Restrictions Weight Bearing Restrictions: Yes LLE Weight Bearing: Partial weight bearing LLE Partial Weight Bearing Percentage or Pounds: 50%    Mobility  Bed Mobility Overal bed mobility: Needs Assistance Bed Mobility: Supine to Sit     Supine to sit: Supervision     General bed mobility comments: Supervision for safety. VCs for technique. Requires extra time. HOB flat and no use of rails.  Transfers Overall transfer level: Needs assistance Equipment used: Rolling walker (2 wheeled) Transfers: Sit to/from Stand Sit to Stand: Min guard         General transfer comment: good use of UE for transfers. Safely maintains 50% WB status on LLE  Ambulation/Gait Ambulation/Gait assistance: Supervision Ambulation Distance (Feet): 35 Feet Assistive device: Rolling walker (2 wheeled) Gait Pattern/deviations: Step-to pattern;Decreased step length - right;Decreased stance time - left;Antalgic   Gait velocity interpretation: Below normal speed for age/gender General Gait Details: Pt doing well with following 50% WB on L LE. HR to 141 during ambulation and SpO2 to 88% on room air.  Educated on pursed lip breathing technique, returned to >90%   Information systems managertairs            Wheelchair Mobility    Modified Rankin (Stroke Patients Only)       Balance                                    Cognition Arousal/Alertness: Awake/alert Behavior During Therapy: WFL for tasks assessed/performed Overall Cognitive Status: Within Functional Limits for tasks assessed                      Exercises Total Joint Exercises Ankle Circles/Pumps: AROM;Both;10 reps;Supine Long Arc Quad: AROM;Left;10 reps;Seated Knee Flexion: AROM;Left;10 reps;Seated    General Comments General comments (skin integrity, edema, etc.): Patient SpO2 at 76% on room air at start of therapy session. Nurse notified and returned to 96% on 3L supplemental O2. Titrated oxygen throughout therapy ranging from 86%-100%. Left in room on 2L supplemental O2 saturating at 94%.      Pertinent Vitals/Pain Pt with no complaints of pain See general comments for vitals during therapy Nurse notified and is aware Patient repositioned in chair for comfort.     Home Living                      Prior Function            PT Goals (current goals can now be found in the care plan section) Acute Rehab PT Goals PT Goal Formulation: With patient Time For Goal Achievement: 03/23/14 Potential to Achieve  Goals: Good Progress towards PT goals: Progressing toward goals    Frequency  7X/week    PT Plan Current plan remains appropriate    Co-evaluation             End of Session   Activity Tolerance: Patient tolerated treatment well Patient left: with call bell/phone within reach;with family/visitor present;in chair     Time: [id number] PT Time Calculation (min): 28 min  Charges:  $Gait Training: 8-22 mins $Therapeutic Activity: 8-22 mins                    G Codes:      [employer] WholesaleLogan Secor Warner, [place]PT [phone]  [place] Mount[place]bour, Leah S 03/18/2014, 1:17 PM

## 2014-03-18 NOTE — H&P (Signed)
CHIEF COMPLAINT:  Painful left knee.   HISTORY OF PRESENT ILLNESS:  Leah Warner is a very pleasant 68 year old white female who is seen today for evaluation of her left knee.  She has had chronic problems with her left knee and it even dates back to 2009 when she was considering a total knee replacement wit Dr. Priscille Kluverendall.  She states though that she has been dealing with this for many years and has now reached the point where the pain and discomfort is now on a daily basis.  She has tried anti-inflammatories, but unfortunately, she has a gastric bypass and is not allowed to really use a fair amount of that material in medicine.  She has now gotten to the point where she pain with every step.  She has tried wearing a knee support, which limits her ability to actually get around.  It has been worsening and she is having problems with activities of daily living.  Going up and down stairs is quite difficult also for her.  She has some giving way occasionally.  She has tried corticosteroid injections and her last one was in December of 2014 and really has not been beneficial except for a few days.  She is even having difficulty at nighttime with sleep.  She comes in today for evaluation.   Her health history has been reviewed.   HOSPITALIZATIONS:  Tonsillectomy 1956, in 2000 mini laparoscopic gastric bypass, 2001 hysterectomy, 1968 for childbirth.   CURRENT MEDICATIONS:  Lisinopril 40 mg daily.   ALLERGIES:  None known, but cannot use anti-inflammatories because of her  gastric bypass.   REVIEW OF SYSTEMS:  A 14-point review of systems is negative except for glasses, bronchitis about 5 years ago.  She has had hypertension over the last 3 to 6 months and has been using lisinopril.  She was given diuretics, but unfortunately she developed leg cramps and they told her not to take them.  She has had anemia with her bypass and she was told that it is mild.  Had gout after her bypass, but has not had anything recently.   FAMILY HISTORY:  Positive for mother who is still alive at 4187 with hypertension and arthritis.  Father who is deceased at age 69 from questionable myocardial infarction.  No brothers.  One sister living at age 69 with hypertension.     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  She denies the use of tobacco or alcohol.   PHYSICAL EXAMINATION:  Examination today reveals a 68 year old white female, well developed, well nourished, alert, pleasant, in moderate distress secondary to left knee pain.  Height:  61.5 inches.  Weighs:  209 pounds.  BMI:  38.8.  Temperature:  98.8.  Pulse:  67.  Respirations:  12.  Blood pressure:  194/82, rechecked at 158/88 in the right arm and left arm was 156/94.  Head is normocephalic.  Eyes:  Pupils equal, round, reactive to light and accommodation with extraocular movements intact.  Ears, nose and throat were benign.  Chest had good expansion.  Lungs were essentially clear.  Cardiac had irregular rhythm and rate.  Normal S1, S2 and no murmurs were detected.  Carotid exam reveals a very faint bruits on the right, nothing on the left.  Pulses were 1+ bilateral and symmetric in lower extremities.  Abdomen was obese, soft, nontender, no masses palpable, normal bowel sounds present.  Genital, rectal and  breast exam not indicated for the orthopaedic evaluation.  CNS:  She is oriented x3 and cranial nerves II-XII grossly intact.  Musculoskeletal:  She lacks around 5 degrees of full extension.  Flexion to about 90 degrees.  She does appear to have a mild effusion.  Crepitus with range of motion is also noted.     RADIOGRAPHS:  X-rays from 08/26/2013 reviewed revealing tricompartment OA essentially bone on bone medially.  Cystic changes in the lateral tibial plateau as well as in the lateral femoral condyle.     CLINICAL IMPRESSION:   1.  Endstage OA of the left knee. 2.  Hypertension. 3.  Status post  gastric bypass. 4.  History of anemia. 5.  History of gout.   RECOMMENDATIONS:   1.  At this time, I have reviewed a medical clearance form from Dr. Leary RocaMahoney who feels that from a medical and cardiac standpoint, she is cleared for possible left total knee arthroplasty. 2.  At this time because of his clearance, we will plan on proceeding with a left total knee arthroplasty.  The procedure risks and benefits were fully explained to the patient in detail.  All questions were answered.  I have gone over her hospital course and hospital stay and she would like to return to her home.  Jacqualine CodeBrian Petrarca, PA-C

## 2014-03-18 NOTE — Progress Notes (Signed)
Critical lab value at 0520 this am. Hemoglobin 6.8.  On call doctor notified.  No new orders.  Rounding doctors will address this am.

## 2014-03-24 LAB — VITAMIN B12 BINDING CAPACITY, BLOOD: VITAMIN B12 BIND CAP: 1325 pg/mL (ref 650–1340)

## 2014-04-09 NOTE — OR Nursing (Signed)
Late entry for type, subtype and infection under procedural recordings 

## 2014-04-17 NOTE — Addendum Note (Signed)
Addendum created 04/17/14 [id number] by Dairl PonderFudan Zayah Keilman, CRNA   Modules edited: Anesthesia Medication Administration

## 2015-06-01 IMAGING — CR DG CHEST 2V
2 series · 2 of 2 positions shown · non-contrast
Comparison: None.

CLINICAL DATA: Preop left knee replacement

EXAM:
CHEST  2 VIEW

[w chest pa]
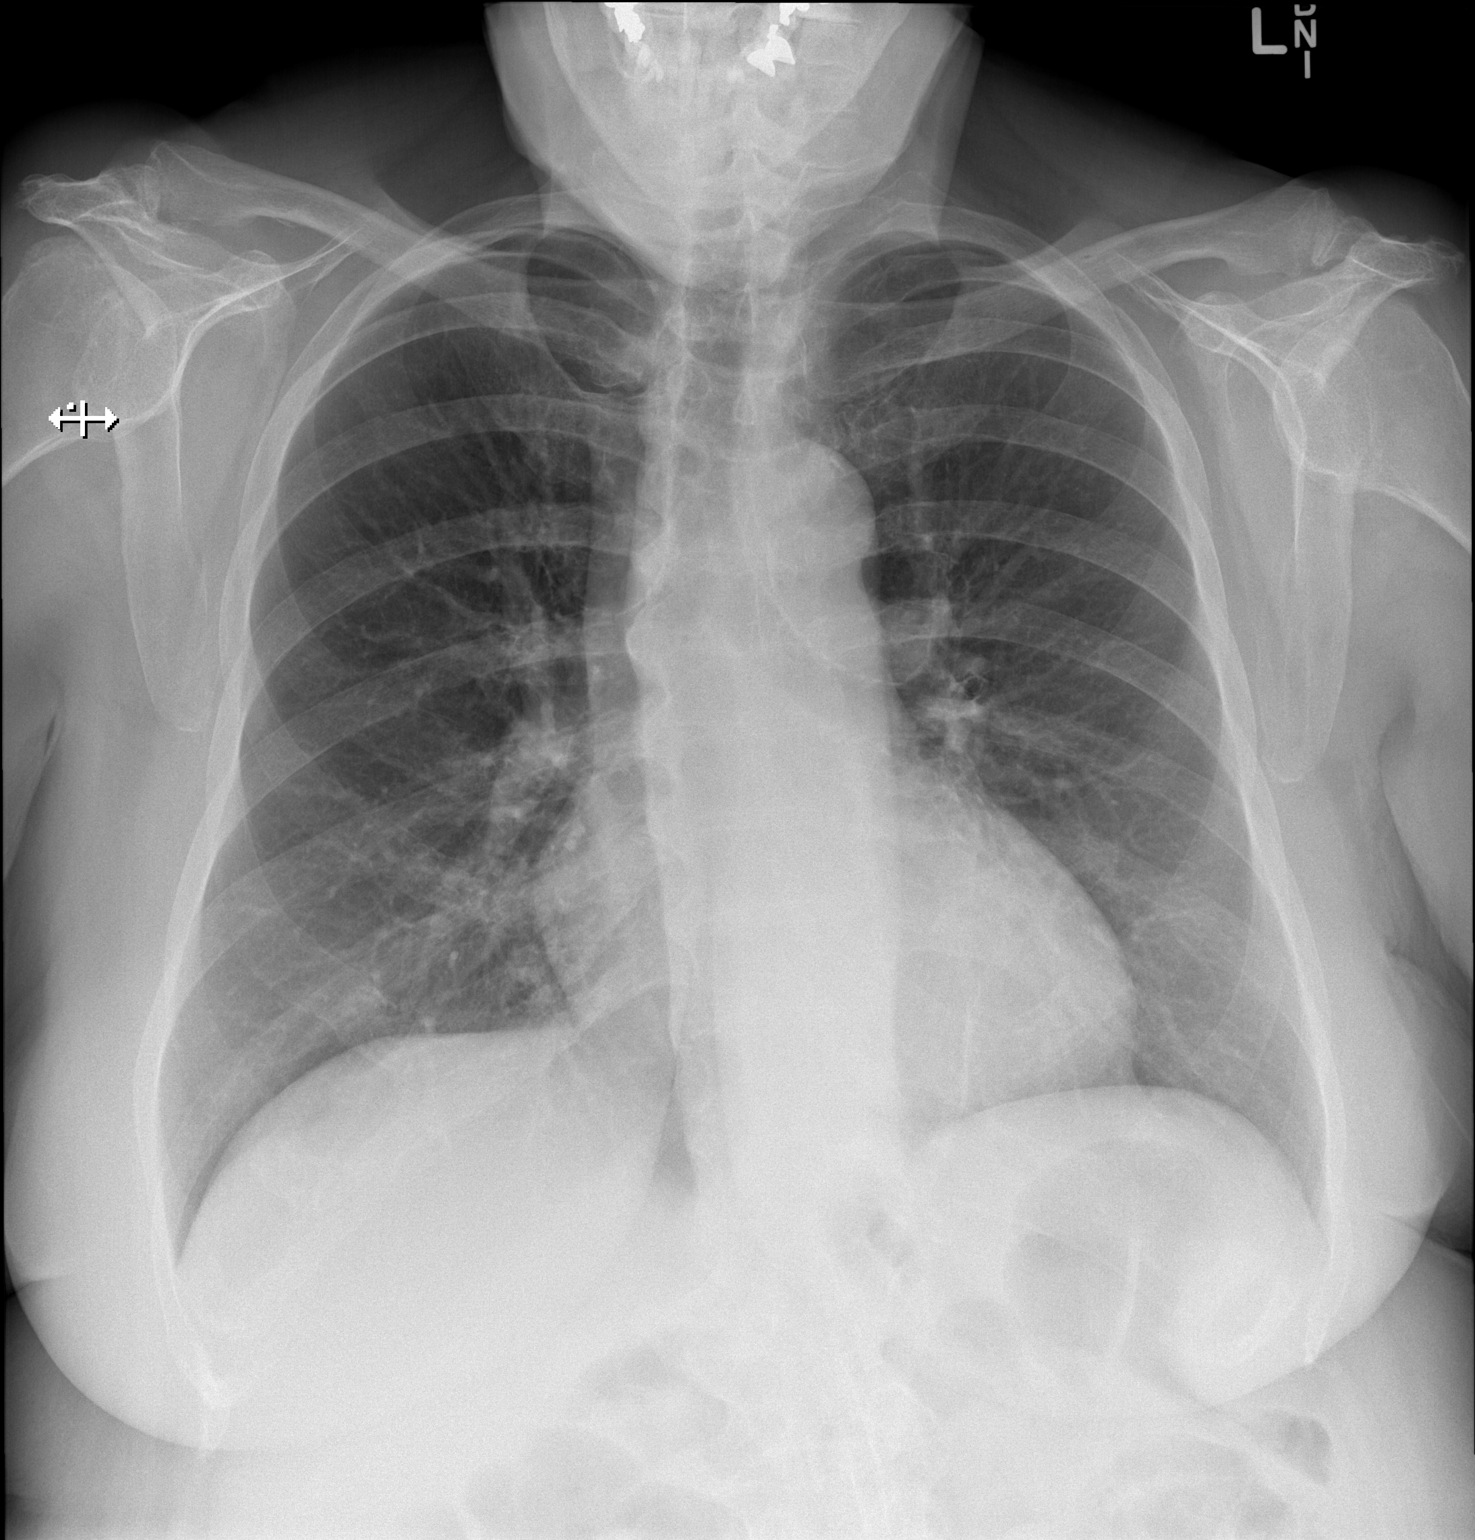

[w chest lat]
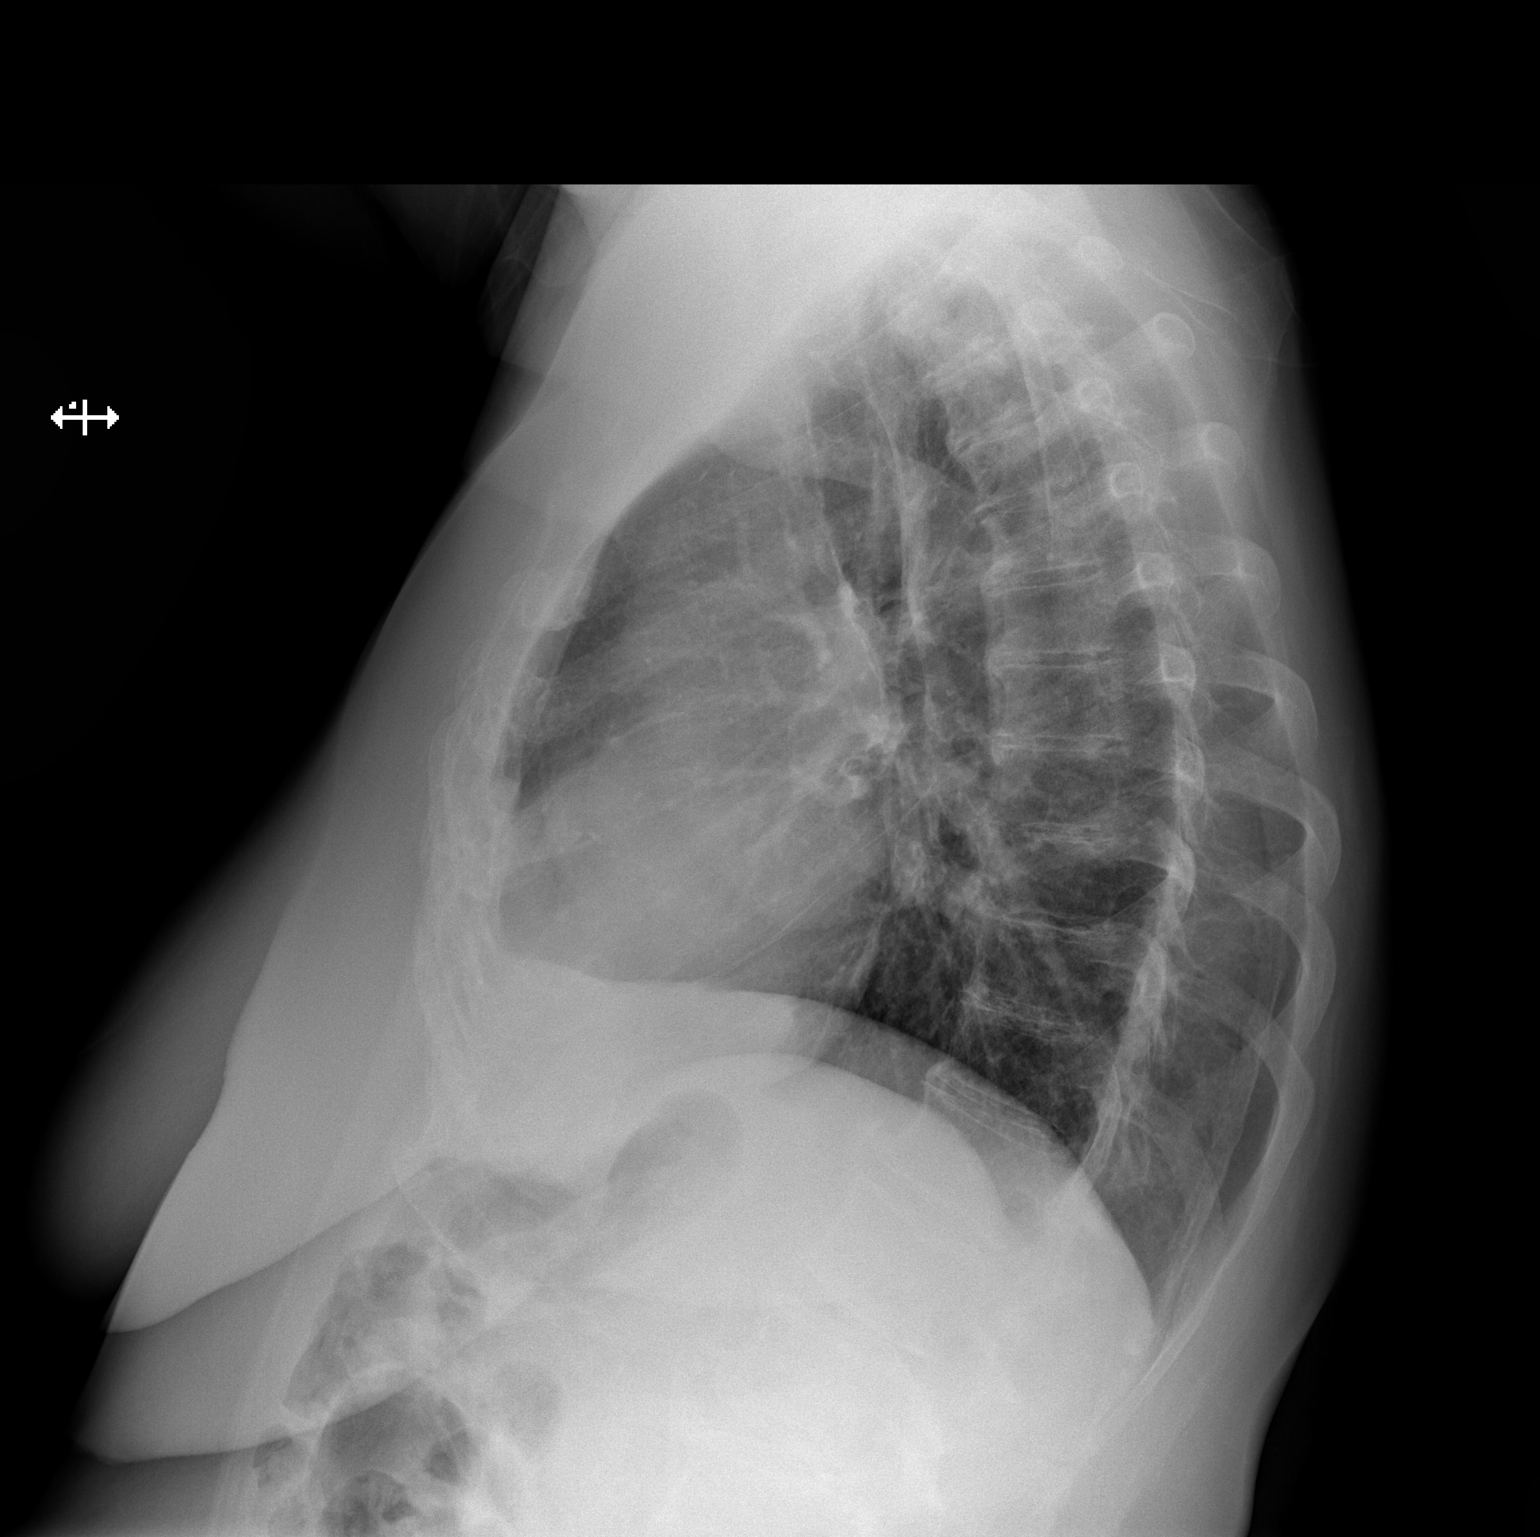

[2 of 2 positions shown; findings below may reference images not displayed]

FINDINGS: The heart size and mediastinal contours are within normal limits.
Both lungs are clear. The visualized skeletal structures are
unremarkable.
IMPRESSION: No active cardiopulmonary disease.
